# Patient Record
Sex: Female | Born: 1995 | Race: White | Hispanic: No | Marital: Single | Smoking: Never smoker
Health system: Southern US, Community
[De-identification: ages and names within clinical notes are randomized; demographics above are authoritative.]

## PROBLEM LIST (undated history)

## (undated) DIAGNOSIS — M1990 Unspecified osteoarthritis, unspecified site: Secondary | ICD-10-CM

## (undated) DIAGNOSIS — R7303 Prediabetes: Principal | ICD-10-CM

## (undated) DIAGNOSIS — M0880 Other juvenile arthritis, unspecified site: Secondary | ICD-10-CM

## (undated) HISTORY — DX: Other juvenile arthritis, unspecified site: M08.80

## (undated) HISTORY — PX: ANKLE ARTHROCENTESIS: SUR45

## (undated) HISTORY — PX: KNEE ARTHROCENTESIS: SUR44

## (undated) HISTORY — PX: WISDOM TOOTH EXTRACTION: SHX21

## (undated) HISTORY — DX: Unspecified osteoarthritis, unspecified site: M19.90

---
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
## 2016-01-26 ENCOUNTER — Ambulatory Visit (INDEPENDENT_AMBULATORY_CARE_PROVIDER_SITE_OTHER): Payer: Federal, State, Local not specified - PPO | Admitting: Family Medicine

## 2016-01-26 VITALS — BP 121/65 | HR 53 | Temp 97.8°F | Resp 16

## 2016-01-26 DIAGNOSIS — J018 Other acute sinusitis: Secondary | ICD-10-CM

## 2016-01-26 DIAGNOSIS — J069 Acute upper respiratory infection, unspecified: Secondary | ICD-10-CM

## 2016-01-26 MED ORDER — CEFDINIR 300 MG PO CAPS: ORAL_CAPSULE | ORAL | 0 refills | Status: DC

## 2016-01-26 NOTE — Progress Notes (Signed)
Patient presents with symptoms of sore throat, nasal congestion, sinus pressure, cough for the last week. Denies fever, N/V/D, abdominal pain, severe headache, fever. Has colored mucus at times. Would like to take medication that her PMD has prescribed in the past for sinusitis.   ROS: Negative except mentioned above.  GENERAL: NAD HEENT: mild pharyngeal erythema, no exudate, no erythema of TMs, no cervical LAD RESP: CTA B CARD: RRR NEURO: CN II-XII grossly intact   A/P: Sinusitis, URI - rapid strep negative, will be on Omnicef as requested by patient, oral antihistamine, nasal spray, Delysm prn, rest, hydration, seek medical attention if symptoms persist or worsen. No athletic activity if febrile.

## 2016-02-01 ENCOUNTER — Ambulatory Visit (INDEPENDENT_AMBULATORY_CARE_PROVIDER_SITE_OTHER): Payer: Federal, State, Local not specified - PPO | Admitting: Family Medicine

## 2016-02-01 VITALS — BP 112/56 | HR 57 | Temp 96.9°F | Resp 14

## 2016-02-01 DIAGNOSIS — J069 Acute upper respiratory infection, unspecified: Secondary | ICD-10-CM

## 2016-02-01 MED ORDER — PREDNISONE 20 MG PO TABS: ORAL_TABLET | ORAL | 0 refills | Status: DC

## 2016-02-01 NOTE — Progress Notes (Signed)
Patient presents today with symptoms of persistent cough and nasal congestion. Patient states that she is continue to take her Omnicef. She is also using Mucinex. She denies any fever or chills. She states the colored mucus has decreased. She denies any chest pain, shortness of breath, severe headache. She denies any history of asthma.  ROS: Negative except mentioned above. Vitals as per Epic. GENERAL: NAD HEENT: mild pharyngeal erythema, no exudate, no erythema of TMs, no cervical LAD RESP: CTA B CARD: RRR NEURO: CN II-XII grossly intact   A/P: URI - continue course of Omnicef, try Delsym instead of Mucinex, can take Tussionex at bedtime if cough worse at night, will also start short course of prednisone, rest, hydration, seek medical attention if symptoms still persist/worsen. Would recommend imaging and/or labs if symptoms persist/worsen.

## 2016-02-22 ENCOUNTER — Encounter: Payer: Self-pay | Admitting: Family Medicine

## 2016-02-22 ENCOUNTER — Ambulatory Visit (INDEPENDENT_AMBULATORY_CARE_PROVIDER_SITE_OTHER): Payer: Federal, State, Local not specified - PPO | Admitting: Family Medicine

## 2016-02-22 VITALS — BP 117/67 | HR 61 | Temp 97.8°F | Resp 16

## 2016-02-22 DIAGNOSIS — M25562 Pain in left knee: Secondary | ICD-10-CM

## 2016-02-22 DIAGNOSIS — M25561 Pain in right knee: Secondary | ICD-10-CM

## 2016-02-22 DIAGNOSIS — J069 Acute upper respiratory infection, unspecified: Secondary | ICD-10-CM

## 2016-02-22 MED ORDER — AZITHROMYCIN 250 MG PO TABS: ORAL_TABLET | ORAL | 0 refills | Status: DC

## 2016-02-22 MED ORDER — PREDNISONE 20 MG PO TABS: ORAL_TABLET | ORAL | 0 refills | Status: AC

## 2016-02-22 NOTE — Progress Notes (Signed)
Patient presents with symptoms of generalized joint pain, sore throat, nasal congestion for the last few days. Patient was sick with similar symptoms a month ago as well. She states that she got better after taking the Omnicef and prednisone that was prescribed last month. She has a teammate that she has been around that has also been sick with upper respiratory symptoms for about a month. She denies any chest pain, shortness of breath, night sweats, severe headache, abdominal pain, nausea, vomiting, diarrhea. She has not had to take any of her oral prednisone at home that she has for flares of her JRA.  ROS: Negative except mentioned above.  Vitals as per Epic.  GENERAL: NAD HEENT: mild pharyngeal erythema, no exudate, no erythema of TMs, mild cervical LAD RESP: CTA B CARD: RRR MSK: no obvious swelling of joints, FROM NEURO: CN II-XII grossly intact   A/P: URI, JRA - Will prescribe patient Z-Pak, oral prednisone for 4 days, rest, hydration, symptomatic relief with antihistamine/decongestant, nasal steroid, cough suppressant. No athletic activity afebrile. Will follow up with me if symptoms do continue to persist or worsen. If she does return I will do labs and imaging if needed.

## 2016-04-05 ENCOUNTER — Ambulatory Visit
Admission: RE | Admit: 2016-04-05 | Discharge: 2016-04-05 | Disposition: A | Discharge status: Home / Self Care | Payer: Federal, State, Local not specified - PPO | Source: Ambulatory Visit | Attending: Family Medicine | Admitting: Family Medicine

## 2016-04-05 ENCOUNTER — Ambulatory Visit (INDEPENDENT_AMBULATORY_CARE_PROVIDER_SITE_OTHER): Payer: Federal, State, Local not specified - PPO | Admitting: Family Medicine

## 2016-04-05 ENCOUNTER — Encounter: Payer: Self-pay | Admitting: Family Medicine

## 2016-04-05 VITALS — BP 115/65 | HR 70 | Temp 98.2°F | Resp 14

## 2016-04-05 DIAGNOSIS — M25531 Pain in right wrist: Secondary | ICD-10-CM | POA: Diagnosis not present

## 2016-04-05 DIAGNOSIS — R0982 Postnasal drip: Secondary | ICD-10-CM

## 2016-04-05 DIAGNOSIS — J309 Allergic rhinitis, unspecified: Secondary | ICD-10-CM

## 2016-04-05 NOTE — Progress Notes (Signed)
Patient presents today with symptoms of right wrist pain that she states she has had for a few months. Thinking back she remembers landing on her right hand and then started to experience pain. Her trainer was taping it during volleyball season. She states that this did not help all the time. She denies any swelling or bruising of the area. 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 She states that the pain that she has in the wrist is different than her other joint pain she has with her arthritis. Her pain is mostly with extension and ulnar deviation. She has pain sometimes when she lifts something underhanded. She denies any instability of the wrist/hand. She will not be doing any volleyball activities for the next few weeks since it is exam time and Christmas break. They will have practice and weight lifting when they return in January. Patient also has had some hoarseness of her voice which is different than her chronic hoarseness. She has some postnasal drip and minimal cough. She denies any significant sore throat, fever or chills.  ROS: Negative except mentioned above. Vitals as per Epic.  GENERAL: NAD HEENT: mild pharyngeal erythema and erythema of uvula, no exudate, no erythema of TMs, no cervical LAD RESP: CTA B CARD: RRR MSK: Right wrist- no obvious deformity, no swelling or ecchymosis appreciated, mild tenderness of the ulnar styloid and mild tenderness proximal to this, full range of motion, discomfort reproduced with ulnar deviation and extension and lifting underhanded, NV intact NEURO: CN II-XII grossly intact    A/P: Right wrist pain - seems to be mostly in the TFCC region, will do x-rays initially to look for any bony abnormality, I have advised her to rest the wrist for the next few weeks and use a wrist splint if needed if has increased pain, if pain persists or worsens would recommend further imaging with MRI and seeing hand/wrist specialist if needed. Discussed with  trainer. Encourage patient to continue her allergy medicine and nasal steroid for postnasal drip and take OTC pain medication if needed. Gargle with some warm salt water for a few days if symptoms worsen seek medical attention.

## 2016-06-02 ENCOUNTER — Ambulatory Visit (INDEPENDENT_AMBULATORY_CARE_PROVIDER_SITE_OTHER): Payer: Federal, State, Local not specified - PPO | Admitting: Family Medicine

## 2016-06-02 VITALS — BP 128/77 | HR 68 | Temp 98.7°F

## 2016-06-02 DIAGNOSIS — Z889 Allergy status to unspecified drugs, medicaments and biological substances status: Secondary | ICD-10-CM | POA: Insufficient documentation

## 2016-06-02 NOTE — Progress Notes (Signed)
Patient presents today for discussion on allergy shots. Patient states that she started getting allergy shots over a month ago. She has a schedule with her from her allergist. She has the vials with her as well. She admits that she has an EpiPen in her purse. She admits to being allergic to several things including dust mites. She has never had allergy shots in the past before now. She denies having any illnesses at this time or fever.  A/P: Immunotherapy/Allergy Shots - Reviewed forms that were given to her by the allergist. CMA has also reviewed this with the RN at the [facility]. Will give allergy shots as prescribed by the allergist. I have informed the patient that she needs to be aware of where her allergy shots will be shipped. I have also reviewed with her the times that we are open during the school year and the summer so she knows when to come in for her injections. Seek medical attention as needed.

## 2016-06-06 ENCOUNTER — Ambulatory Visit (INDEPENDENT_AMBULATORY_CARE_PROVIDER_SITE_OTHER): Payer: Federal, State, Local not specified - PPO | Admitting: Family Medicine

## 2016-06-06 DIAGNOSIS — Z889 Allergy status to unspecified drugs, medicaments and biological substances status: Secondary | ICD-10-CM

## 2016-06-09 ENCOUNTER — Ambulatory Visit (INDEPENDENT_AMBULATORY_CARE_PROVIDER_SITE_OTHER): Payer: Federal, State, Local not specified - PPO | Admitting: Family Medicine

## 2016-06-09 DIAGNOSIS — Z889 Allergy status to unspecified drugs, medicaments and biological substances status: Secondary | ICD-10-CM

## 2016-06-13 ENCOUNTER — Ambulatory Visit (INDEPENDENT_AMBULATORY_CARE_PROVIDER_SITE_OTHER): Payer: Federal, State, Local not specified - PPO | Admitting: Family Medicine

## 2016-06-13 DIAGNOSIS — Z889 Allergy status to unspecified drugs, medicaments and biological substances status: Secondary | ICD-10-CM

## 2016-06-16 ENCOUNTER — Ambulatory Visit (INDEPENDENT_AMBULATORY_CARE_PROVIDER_SITE_OTHER): Payer: Federal, State, Local not specified - PPO | Admitting: Family Medicine

## 2016-06-16 DIAGNOSIS — Z889 Allergy status to unspecified drugs, medicaments and biological substances status: Secondary | ICD-10-CM

## 2016-06-21 ENCOUNTER — Ambulatory Visit (INDEPENDENT_AMBULATORY_CARE_PROVIDER_SITE_OTHER): Payer: Federal, State, Local not specified - PPO | Admitting: Family Medicine

## 2016-06-21 DIAGNOSIS — Z889 Allergy status to unspecified drugs, medicaments and biological substances status: Secondary | ICD-10-CM

## 2016-06-23 ENCOUNTER — Encounter: Payer: Self-pay | Admitting: Family Medicine

## 2016-06-23 ENCOUNTER — Ambulatory Visit (INDEPENDENT_AMBULATORY_CARE_PROVIDER_SITE_OTHER): Payer: Federal, State, Local not specified - PPO | Admitting: Family Medicine

## 2016-06-23 VITALS — BP 112/62 | HR 62 | Temp 98.1°F | Resp 14

## 2016-06-23 DIAGNOSIS — J069 Acute upper respiratory infection, unspecified: Secondary | ICD-10-CM

## 2016-06-24 NOTE — Progress Notes (Signed)
Look at scanned H&P. 

## 2016-06-27 ENCOUNTER — Ambulatory Visit (INDEPENDENT_AMBULATORY_CARE_PROVIDER_SITE_OTHER): Payer: Federal, State, Local not specified - PPO | Admitting: Family Medicine

## 2016-06-27 DIAGNOSIS — Z889 Allergy status to unspecified drugs, medicaments and biological substances status: Secondary | ICD-10-CM

## 2016-06-28 ENCOUNTER — Ambulatory Visit (INDEPENDENT_AMBULATORY_CARE_PROVIDER_SITE_OTHER): Payer: Federal, State, Local not specified - PPO | Admitting: Family Medicine

## 2016-06-28 DIAGNOSIS — Z889 Allergy status to unspecified drugs, medicaments and biological substances status: Secondary | ICD-10-CM

## 2016-06-30 ENCOUNTER — Ambulatory Visit (INDEPENDENT_AMBULATORY_CARE_PROVIDER_SITE_OTHER): Payer: Federal, State, Local not specified - PPO | Admitting: Family Medicine

## 2016-06-30 DIAGNOSIS — Z889 Allergy status to unspecified drugs, medicaments and biological substances status: Secondary | ICD-10-CM

## 2016-07-04 ENCOUNTER — Ambulatory Visit (INDEPENDENT_AMBULATORY_CARE_PROVIDER_SITE_OTHER): Payer: Federal, State, Local not specified - PPO | Admitting: Family Medicine

## 2016-07-04 DIAGNOSIS — Z889 Allergy status to unspecified drugs, medicaments and biological substances status: Secondary | ICD-10-CM

## 2016-07-05 ENCOUNTER — Ambulatory Visit (INDEPENDENT_AMBULATORY_CARE_PROVIDER_SITE_OTHER): Payer: Federal, State, Local not specified - PPO | Admitting: Family Medicine

## 2016-07-05 DIAGNOSIS — Z889 Allergy status to unspecified drugs, medicaments and biological substances status: Secondary | ICD-10-CM

## 2016-07-06 ENCOUNTER — Ambulatory Visit (INDEPENDENT_AMBULATORY_CARE_PROVIDER_SITE_OTHER): Payer: Federal, State, Local not specified - PPO | Admitting: Family Medicine

## 2016-07-06 DIAGNOSIS — Z889 Allergy status to unspecified drugs, medicaments and biological substances status: Secondary | ICD-10-CM

## 2016-07-12 ENCOUNTER — Ambulatory Visit (INDEPENDENT_AMBULATORY_CARE_PROVIDER_SITE_OTHER): Payer: Federal, State, Local not specified - PPO | Admitting: Family Medicine

## 2016-07-12 VITALS — Temp 98.3°F

## 2016-07-12 DIAGNOSIS — Z889 Allergy status to unspecified drugs, medicaments and biological substances status: Secondary | ICD-10-CM

## 2016-07-13 ENCOUNTER — Encounter: Payer: Self-pay | Admitting: Family Medicine

## 2016-07-13 ENCOUNTER — Ambulatory Visit (INDEPENDENT_AMBULATORY_CARE_PROVIDER_SITE_OTHER): Payer: Federal, State, Local not specified - PPO | Admitting: Family Medicine

## 2016-07-13 VITALS — BP 123/69 | HR 78 | Temp 98.7°F | Resp 14

## 2016-07-13 DIAGNOSIS — J018 Other acute sinusitis: Secondary | ICD-10-CM

## 2016-07-13 MED ORDER — AMOXICILLIN 500 MG PO CAPS: 500.0000 mg | ORAL_CAPSULE | Freq: Two times a day (BID) | ORAL | 0 refills | Status: AC

## 2016-07-13 NOTE — Progress Notes (Signed)
Patient presents today for sinus congestion which patient states is green in color. Patient states that she has had a runny nose for the last few days but then started to become colored yesterday. She does normally take a nasal steroid and Allegra for her allergies. She is getting allergy shots and is scheduled to have one today. She denies any fever. She has mild sore throat and some cough. She denies chest pain or shortness of breath. She does have asthma and does have an Albuterol inhaler but has not used it today. She denies any history of a deviated septum or other nasal problems.  ROS: Negative except mentioned above. Vitals as per Epic.  GENERAL: NAD HEENT: mild pharyngeal erythema, no exudate, no erythema of TMs, no cervical LAD RESP: CTA B, no accessory muscle use, no wheezing CARD: RRR NEURO: CN II-XII grossly intact   A/P: Sinusitis, URI - will treat with Amoxicillin, continue nasal steroid and oral antihistamine, Tylenol/NSAIDs when necessary, rest, hydration, seek medical attention if symptoms persist or worsen. Patient will call allergy physician to see if she can continue her allergy shots today.

## 2016-08-09 ENCOUNTER — Ambulatory Visit (INDEPENDENT_AMBULATORY_CARE_PROVIDER_SITE_OTHER): Payer: Federal, State, Local not specified - PPO | Admitting: Family Medicine

## 2016-08-09 DIAGNOSIS — Z889 Allergy status to unspecified drugs, medicaments and biological substances status: Secondary | ICD-10-CM

## 2016-08-11 ENCOUNTER — Ambulatory Visit (INDEPENDENT_AMBULATORY_CARE_PROVIDER_SITE_OTHER): Payer: Federal, State, Local not specified - PPO | Admitting: Family Medicine

## 2016-08-11 DIAGNOSIS — Z889 Allergy status to unspecified drugs, medicaments and biological substances status: Secondary | ICD-10-CM

## 2016-08-16 ENCOUNTER — Ambulatory Visit (INDEPENDENT_AMBULATORY_CARE_PROVIDER_SITE_OTHER): Payer: Federal, State, Local not specified - PPO | Admitting: Family Medicine

## 2016-08-16 DIAGNOSIS — Z889 Allergy status to unspecified drugs, medicaments and biological substances status: Secondary | ICD-10-CM

## 2016-08-18 ENCOUNTER — Ambulatory Visit (INDEPENDENT_AMBULATORY_CARE_PROVIDER_SITE_OTHER): Payer: Federal, State, Local not specified - PPO | Admitting: Family Medicine

## 2016-08-18 DIAGNOSIS — Z889 Allergy status to unspecified drugs, medicaments and biological substances status: Secondary | ICD-10-CM

## 2016-08-23 ENCOUNTER — Ambulatory Visit (INDEPENDENT_AMBULATORY_CARE_PROVIDER_SITE_OTHER): Payer: Federal, State, Local not specified - PPO | Admitting: Family Medicine

## 2016-08-23 DIAGNOSIS — Z889 Allergy status to unspecified drugs, medicaments and biological substances status: Secondary | ICD-10-CM

## 2016-08-25 ENCOUNTER — Ambulatory Visit (INDEPENDENT_AMBULATORY_CARE_PROVIDER_SITE_OTHER): Payer: Federal, State, Local not specified - PPO | Admitting: Family Medicine

## 2016-08-25 DIAGNOSIS — Z889 Allergy status to unspecified drugs, medicaments and biological substances status: Secondary | ICD-10-CM

## 2016-08-30 ENCOUNTER — Ambulatory Visit: Payer: Federal, State, Local not specified - PPO | Admitting: Family Medicine

## 2016-09-06 ENCOUNTER — Ambulatory Visit (INDEPENDENT_AMBULATORY_CARE_PROVIDER_SITE_OTHER): Payer: Federal, State, Local not specified - PPO | Admitting: Family Medicine

## 2016-09-06 DIAGNOSIS — Z889 Allergy status to unspecified drugs, medicaments and biological substances status: Secondary | ICD-10-CM

## 2016-12-16 ENCOUNTER — Encounter: Payer: Self-pay | Admitting: Family Medicine

## 2016-12-16 ENCOUNTER — Ambulatory Visit (INDEPENDENT_AMBULATORY_CARE_PROVIDER_SITE_OTHER): Payer: Federal, State, Local not specified - PPO | Admitting: Family Medicine

## 2016-12-16 VITALS — BP 105/68 | HR 84 | Temp 98.0°F | Resp 14

## 2016-12-16 DIAGNOSIS — N760 Acute vaginitis: Secondary | ICD-10-CM

## 2016-12-16 DIAGNOSIS — R300 Dysuria: Secondary | ICD-10-CM

## 2016-12-16 LAB — POCT URINE PREGNANCY: PREG TEST UR: NEGATIVE

## 2016-12-16 MED ORDER — FLUCONAZOLE 150 MG PO TABS: 150.0000 mg | ORAL_TABLET | Freq: Once | ORAL | 1 refills | Status: AC

## 2016-12-16 NOTE — Progress Notes (Signed)
Patient presents today with some burning with urination and itchiness in the vaginal area. She denies any vaginal lesions or rash. She has experienced this for the last few days. She wonders whether she has yeast infection because she has been wearing spandex for several hours with volleyball daily. She does describe some discharge but it does not have an odor to it. She is sexually active but is having protected sex with condoms. She admits that her last menstrual cycle was August 2. She denies any history of STDs. She denies any abdominal pain, fever, chills.  ROS: Negative except mentioned above. Vitals as per Epic GENERAL: NAD RESP: CTA B CARD: RRR ABD: Positive bowel sounds, nontender, no rebound or guarding, no flank tenderness GU: deferred NEURO: CN II-XII grossly intact   Urine Dip - leukocytes negative, nitrites negative, trace protein, negative blood, specific gravity 1.010, trace ketones, negative glucose, pH 5.0 Urine pregnancy - negative  A/P: Vaginitis/Dysuria - will treat for yeast infection, keep area dry and clean, avoid staying and sweaty clothes for long periods of time, if symptoms do not resolve would recommend doing a full pelvic exam, patient addresses understanding of plan.

## 2016-12-20 ENCOUNTER — Ambulatory Visit (INDEPENDENT_AMBULATORY_CARE_PROVIDER_SITE_OTHER): Payer: Federal, State, Local not specified - PPO | Admitting: Family Medicine

## 2016-12-20 ENCOUNTER — Ambulatory Visit: Payer: Federal, State, Local not specified - PPO | Admitting: Family Medicine

## 2016-12-20 DIAGNOSIS — Z889 Allergy status to unspecified drugs, medicaments and biological substances status: Secondary | ICD-10-CM

## 2016-12-27 ENCOUNTER — Encounter: Payer: Self-pay | Admitting: Family Medicine

## 2016-12-27 ENCOUNTER — Ambulatory Visit (INDEPENDENT_AMBULATORY_CARE_PROVIDER_SITE_OTHER): Payer: Federal, State, Local not specified - PPO | Admitting: Family Medicine

## 2016-12-27 ENCOUNTER — Ambulatory Visit: Payer: Federal, State, Local not specified - PPO | Admitting: Family Medicine

## 2016-12-27 VITALS — BP 129/78 | HR 73 | Temp 98.4°F | Resp 14

## 2016-12-27 DIAGNOSIS — R0982 Postnasal drip: Secondary | ICD-10-CM

## 2016-12-27 NOTE — Progress Notes (Signed)
Patient presents today with symptoms of nasal congestion, nonproductive cough. Patient states that she's had the symptoms for the last 2 days. She denies any chest pain, shortness of breath, fever, severe headache. She denies being late with any of her allergy shots.  ROS: Negative except mentioned above. Vitals as per Epic. GENERAL: NAD HEENT: mild pharyngeal erythema, no exudate, no erythema of TMs, no cervical LAD RESP: CTA B CARD: RRR NEURO: CN II-XII grossly intact  A/P: Viral Illness, Postnasal drip - patient can try taking Claritin or Zyrtec, Tylenol/Ibuprofen when necessary, Delsym when necessary cough, monitor for any worsening symptoms. Recommend no athletic activity if febrile. Seek medical attention if symptoms persist or worsen as discussed.

## 2016-12-29 ENCOUNTER — Ambulatory Visit (INDEPENDENT_AMBULATORY_CARE_PROVIDER_SITE_OTHER): Payer: Federal, State, Local not specified - PPO | Admitting: Family Medicine

## 2016-12-29 DIAGNOSIS — Z889 Allergy status to unspecified drugs, medicaments and biological substances status: Secondary | ICD-10-CM

## 2017-01-03 ENCOUNTER — Ambulatory Visit (INDEPENDENT_AMBULATORY_CARE_PROVIDER_SITE_OTHER): Payer: Federal, State, Local not specified - PPO | Admitting: Family Medicine

## 2017-01-03 DIAGNOSIS — Z889 Allergy status to unspecified drugs, medicaments and biological substances status: Secondary | ICD-10-CM

## 2017-01-05 ENCOUNTER — Ambulatory Visit
Admission: RE | Admit: 2017-01-05 | Discharge: 2017-01-05 | Disposition: A | Discharge status: Home / Self Care | Payer: Federal, State, Local not specified - PPO | Source: Ambulatory Visit | Attending: Family Medicine | Admitting: Family Medicine

## 2017-01-05 ENCOUNTER — Ambulatory Visit (INDEPENDENT_AMBULATORY_CARE_PROVIDER_SITE_OTHER): Payer: Federal, State, Local not specified - PPO | Admitting: Family Medicine

## 2017-01-05 DIAGNOSIS — M899 Disorder of bone, unspecified: Secondary | ICD-10-CM | POA: Diagnosis not present

## 2017-01-05 DIAGNOSIS — M25572 Pain in left ankle and joints of left foot: Secondary | ICD-10-CM

## 2017-01-05 DIAGNOSIS — Z889 Allergy status to unspecified drugs, medicaments and biological substances status: Secondary | ICD-10-CM

## 2017-01-05 NOTE — Progress Notes (Signed)
Patient presents today with symptoms of left ankle pain for the past few days. Patient states that she has had a flare of her RA in her left ankle this past summer. She did get a steroid injection in the ankle which did relieve her symptoms. She has not had any pain with her left ankle since starting the volleyball season until now. She describes the pain as being similar to her joint pain in other places related to her RA. She denies any significant swelling or erythema of the area. She describes the pain as being around the pallor area and extending into the proximal foot. Her rheumatologist will not be back until next week. The nurse stated that she would benefit from a tapered dose of steroids. She currently is on methotrexate, another RA medication, and Naprosyn.  ROS: Negative except mentioned above. Vitals as per Epic. GENERAL: NAD RESP: CTA B CARD: RRR MSK: Left Ankle - no obvious erythema, ecchymosis or swelling, generalized tenderness in the talar dome area extending into the proximal foot, full range of motion, NV intact NEURO: CN II-XII grossly intact   A/P: Left ankle pain - likely related to her RA, will to x-rays however of the ankle, will proceed with doing tapered dose of steroids for 12 days, patient states that she has used tapered dose of steroids for 12 days in the past. Will stop taking Naprosyn for now while on Prednisone. Will seek medical attention if symptoms persist or worsen as discussed.

## 2017-01-10 ENCOUNTER — Ambulatory Visit (INDEPENDENT_AMBULATORY_CARE_PROVIDER_SITE_OTHER): Payer: Federal, State, Local not specified - PPO | Admitting: Family Medicine

## 2017-01-10 DIAGNOSIS — Z889 Allergy status to unspecified drugs, medicaments and biological substances status: Secondary | ICD-10-CM

## 2017-01-19 ENCOUNTER — Ambulatory Visit (INDEPENDENT_AMBULATORY_CARE_PROVIDER_SITE_OTHER): Payer: Federal, State, Local not specified - PPO | Admitting: Family Medicine

## 2017-01-19 DIAGNOSIS — Z889 Allergy status to unspecified drugs, medicaments and biological substances status: Secondary | ICD-10-CM

## 2017-01-24 ENCOUNTER — Ambulatory Visit (INDEPENDENT_AMBULATORY_CARE_PROVIDER_SITE_OTHER): Payer: Federal, State, Local not specified - PPO | Admitting: Family Medicine

## 2017-01-24 DIAGNOSIS — Z889 Allergy status to unspecified drugs, medicaments and biological substances status: Secondary | ICD-10-CM

## 2017-02-07 ENCOUNTER — Ambulatory Visit: Payer: Federal, State, Local not specified - PPO | Admitting: Family Medicine

## 2017-02-14 ENCOUNTER — Ambulatory Visit (INDEPENDENT_AMBULATORY_CARE_PROVIDER_SITE_OTHER): Payer: Federal, State, Local not specified - PPO | Admitting: Family Medicine

## 2017-02-14 DIAGNOSIS — Z889 Allergy status to unspecified drugs, medicaments and biological substances status: Secondary | ICD-10-CM

## 2017-02-16 ENCOUNTER — Ambulatory Visit (INDEPENDENT_AMBULATORY_CARE_PROVIDER_SITE_OTHER): Payer: Federal, State, Local not specified - PPO | Admitting: Family Medicine

## 2017-02-16 DIAGNOSIS — Z889 Allergy status to unspecified drugs, medicaments and biological substances status: Secondary | ICD-10-CM

## 2017-02-21 ENCOUNTER — Ambulatory Visit (INDEPENDENT_AMBULATORY_CARE_PROVIDER_SITE_OTHER): Payer: Federal, State, Local not specified - PPO | Admitting: Family Medicine

## 2017-02-21 DIAGNOSIS — Z889 Allergy status to unspecified drugs, medicaments and biological substances status: Secondary | ICD-10-CM

## 2017-02-23 ENCOUNTER — Ambulatory Visit (INDEPENDENT_AMBULATORY_CARE_PROVIDER_SITE_OTHER): Payer: Federal, State, Local not specified - PPO | Admitting: Family Medicine

## 2017-02-23 VITALS — BP 122/78 | HR 57 | Temp 98.3°F | Resp 14

## 2017-02-23 DIAGNOSIS — Z889 Allergy status to unspecified drugs, medicaments and biological substances status: Secondary | ICD-10-CM

## 2017-02-28 ENCOUNTER — Ambulatory Visit (INDEPENDENT_AMBULATORY_CARE_PROVIDER_SITE_OTHER): Payer: Federal, State, Local not specified - PPO | Admitting: Family Medicine

## 2017-02-28 DIAGNOSIS — Z889 Allergy status to unspecified drugs, medicaments and biological substances status: Secondary | ICD-10-CM

## 2017-03-14 ENCOUNTER — Encounter: Payer: Self-pay | Admitting: Family Medicine

## 2017-03-14 ENCOUNTER — Ambulatory Visit (INDEPENDENT_AMBULATORY_CARE_PROVIDER_SITE_OTHER): Payer: Federal, State, Local not specified - PPO | Admitting: Family Medicine

## 2017-03-14 ENCOUNTER — Other Ambulatory Visit: Payer: Self-pay | Admitting: Family Medicine

## 2017-03-14 DIAGNOSIS — Z889 Allergy status to unspecified drugs, medicaments and biological substances status: Secondary | ICD-10-CM

## 2017-03-14 DIAGNOSIS — S4992XA Unspecified injury of left shoulder and upper arm, initial encounter: Secondary | ICD-10-CM

## 2017-03-14 NOTE — Progress Notes (Signed)
Patient presents today with posterior left shoulder pain. Patient states that during the volleyball game on Sunday she landed on an outstretched left arm. She states that she was able to continue to play volleyball but started to have discomfort later that evening. It has been uncomfortable to sleep on that shoulder as well. She denies any history of left shoulder dislocation or subluxation in the past. She does admit to having her right shoulder sublux one time. She denies any significant weakness of the left arm. She denies any tingling or numbness of the left arm. She has started to take Naproxen 500 mg twice daily. The volleyball season is over.   ROS: Negative except mentioned above. Vitals as per Epic. GENERAL: NAD RESP: CTA B CARD: RRR MSK: Left shoulder - no obvious deformity, posterior shoulder tenderness > anterior shoulder tenderness, full range of motion, discomfort with external rotation and abduction, negative empty can, negative lift off, positive O'Brien's, positive apprehension, negative cross arm, NV intact NEURO: CN II-XII grossly intact   A/P: Left shoulder injury - will get x-rays, continue Naprosyn through Thanksgiving break and follow up with me if symptoms persist or worsen after the break, if symptoms are persistent or worsening will do MRI to look at labrum, etc. Can do lower body activity. Patient addresses understanding and has no further questions.

## 2017-03-27 ENCOUNTER — Ambulatory Visit
Admission: RE | Admit: 2017-03-27 | Discharge: 2017-03-27 | Disposition: A | Discharge status: Home / Self Care | Payer: Federal, State, Local not specified - PPO | Source: Ambulatory Visit | Attending: Family Medicine | Admitting: Family Medicine

## 2017-03-27 DIAGNOSIS — X58XXXA Exposure to other specified factors, initial encounter: Secondary | ICD-10-CM | POA: Insufficient documentation

## 2017-03-27 DIAGNOSIS — S4992XA Unspecified injury of left shoulder and upper arm, initial encounter: Secondary | ICD-10-CM

## 2017-03-27 DIAGNOSIS — M25512 Pain in left shoulder: Secondary | ICD-10-CM | POA: Insufficient documentation

## 2017-03-28 ENCOUNTER — Ambulatory Visit (INDEPENDENT_AMBULATORY_CARE_PROVIDER_SITE_OTHER): Payer: Federal, State, Local not specified - PPO | Admitting: Family Medicine

## 2017-03-28 ENCOUNTER — Encounter: Payer: Self-pay | Admitting: Family Medicine

## 2017-03-28 ENCOUNTER — Other Ambulatory Visit: Payer: Self-pay | Admitting: Family Medicine

## 2017-03-28 DIAGNOSIS — M25512 Pain in left shoulder: Secondary | ICD-10-CM

## 2017-03-28 NOTE — Progress Notes (Signed)
Patient presents today for follow-up regarding her left shoulder pain. Patient states that time and rest have not helped much regarding her left shoulder pain since volleyball has ended. She still has pain along the anterior aspect of the shoulder. She does feel heaviness of the shoulder at times. She denies any swelling of her arm or any tingling or numbness. She does have some difficulty sleeping on that shoulder. She did attempt to do some lifting in the weight room yesterday that seemed to exacerbate her symptoms. She denies any subluxation or dislocation episodes. This pain initially started after landing on an outstretched left arm playing volleyball.  ROS: Negative except mentioned above.  GENERAL: NAD MSK: Left Shoulder - mild to moderate anterior tenderness, full range of motion, positive apprehension, negative empty can, negative impingement signs, negative O'Brien's, negative lift off, NV intact NEURO: CN II-XII grossly intact   A/P: Left Shoulder Pain - reviewed x-rays, given persistent symptoms will move forward with doing an MRI to further evaluate for labral injury, etc. Would recommend not doing any lifting with the left arm in the weight room for now. Pain medication when necessary. Will discuss plan with trainer. Seek medical attention if any acute worsening symptoms.

## 2017-03-31 ENCOUNTER — Ambulatory Visit
Admission: RE | Admit: 2017-03-31 | Discharge: 2017-03-31 | Disposition: A | Discharge status: Home / Self Care | Payer: Federal, State, Local not specified - PPO | Source: Ambulatory Visit | Attending: Family Medicine | Admitting: Family Medicine

## 2017-03-31 ENCOUNTER — Other Ambulatory Visit: Payer: Self-pay | Admitting: Family Medicine

## 2017-03-31 DIAGNOSIS — M25512 Pain in left shoulder: Secondary | ICD-10-CM | POA: Diagnosis present

## 2017-07-03 ENCOUNTER — Ambulatory Visit (INDEPENDENT_AMBULATORY_CARE_PROVIDER_SITE_OTHER): Payer: Federal, State, Local not specified - PPO | Admitting: Family Medicine

## 2017-07-03 ENCOUNTER — Encounter: Payer: Self-pay | Admitting: Family Medicine

## 2017-07-03 DIAGNOSIS — Z889 Allergy status to unspecified drugs, medicaments and biological substances status: Secondary | ICD-10-CM

## 2017-07-03 DIAGNOSIS — M79671 Pain in right foot: Secondary | ICD-10-CM

## 2017-07-03 NOTE — Progress Notes (Signed)
Patient presents today with symptoms of right foot pain. Patient states that she ran a 5K yesterday and then went to watch the [employer] game. She states that she did not have any pain significant pain to her foot after running the 5K but did have pain 2 hours later after watching the [employer] game. She denies any history of having a stress injury in the past. She states that normally she runs about 1-3 miles a week. She admits to having pain at the end of the volleyball season in November in the same area but thought it was due to her shoes being too tight. She denies any pain to the left foot. She denies having any pain leading up to running the 5K yesterday. She denies any menstrual irregularities. She denies any known vitamin D deficiency. She denies having 400 miles on her running shoes.  ROS: Negative except mentioned above. Vitals as per Epic. GENERAL: NAD MSK: Right foot - no obvious swelling or ecchymosis, there is mild tenderness at the base of the fifth metatarsal and slightly distal to this, full range of motion, no significant pain with walking on heels or toes, positive hop test, NV intact NEURO: CN II-XII grossly intact   A/P: Right foot pain - will get x-rays of her foot to look at the base of the fifth metatarsal, NSAIDs when necessary, ice when necessary, advised patient to wear a walking boot for now and is pain with weightbearing to use crutches, seek medical attention if any acute problems. Will discuss above with trainer.

## 2017-07-04 ENCOUNTER — Ambulatory Visit
Admission: RE | Admit: 2017-07-04 | Discharge: 2017-07-04 | Disposition: A | Discharge status: Home / Self Care | Payer: Federal, State, Local not specified - PPO | Source: Ambulatory Visit | Attending: Family Medicine | Admitting: Family Medicine

## 2017-07-04 DIAGNOSIS — M79671 Pain in right foot: Secondary | ICD-10-CM | POA: Insufficient documentation

## 2017-07-06 ENCOUNTER — Other Ambulatory Visit: Payer: Self-pay | Admitting: Family Medicine

## 2017-07-06 DIAGNOSIS — M79671 Pain in right foot: Secondary | ICD-10-CM

## 2017-07-08 ENCOUNTER — Ambulatory Visit
Admission: RE | Admit: 2017-07-08 | Discharge: 2017-07-08 | Disposition: A | Discharge status: Home / Self Care | Payer: Federal, State, Local not specified - PPO | Source: Ambulatory Visit | Attending: Family Medicine | Admitting: Family Medicine

## 2017-07-08 DIAGNOSIS — R609 Edema, unspecified: Secondary | ICD-10-CM | POA: Diagnosis not present

## 2017-07-08 DIAGNOSIS — M79671 Pain in right foot: Secondary | ICD-10-CM

## 2017-07-08 DIAGNOSIS — Q742 Other congenital malformations of lower limb(s), including pelvic girdle: Secondary | ICD-10-CM | POA: Insufficient documentation

## 2017-07-08 DIAGNOSIS — M659 Synovitis and tenosynovitis, unspecified: Secondary | ICD-10-CM | POA: Insufficient documentation

## 2017-07-17 ENCOUNTER — Ambulatory Visit (INDEPENDENT_AMBULATORY_CARE_PROVIDER_SITE_OTHER): Payer: Federal, State, Local not specified - PPO | Admitting: Family Medicine

## 2017-07-17 DIAGNOSIS — Z889 Allergy status to unspecified drugs, medicaments and biological substances status: Secondary | ICD-10-CM

## 2017-07-20 ENCOUNTER — Ambulatory Visit (INDEPENDENT_AMBULATORY_CARE_PROVIDER_SITE_OTHER): Payer: Federal, State, Local not specified - PPO | Admitting: Podiatry

## 2017-07-20 ENCOUNTER — Encounter: Payer: Self-pay | Admitting: Podiatry

## 2017-07-20 DIAGNOSIS — M7671 Peroneal tendinitis, right leg: Secondary | ICD-10-CM

## 2017-07-20 DIAGNOSIS — S93601A Unspecified sprain of right foot, initial encounter: Secondary | ICD-10-CM

## 2017-07-20 NOTE — Progress Notes (Signed)
This patient presents the office with chief complaint of pain on the outside top of her right foot.  She says the pain has been present for 3 weeks.  She says she aggravated her foot running a 5 K.  She says that she went to the urgent care where she was diagnosed as having tendinitis and possible bone injury to the outside of her right midfoot.  She says that x-rays were performed as well as an MRI.  These studies reported a tendinitis to the peroneal tendon right foot.  No bony pathology was noted at the site of her pain.  She was also been wearing a Cam Walker on her right foot.  She says she had severe pain and discomfort when the pain was initially present but her pain level is now 2 out of 10.  She says she has been taking naproxen for pain. She presents to the office  today for an evaluation and treatment of her right foot.  General Appearance  Alert, conversant and in no acute stress.  Vascular  Dorsalis pedis and posterior tibial  pulses are palpable  bilaterally.  Capillary return is within normal limits  bilaterally. Temperature is within normal limits  bilaterally.  Neurologic  Senn-Weinstein monofilament wire test within normal limits  bilaterally. Muscle power within normal limits bilaterally.  Nails Normal nails noted with no evidence of bacterial or fungal infections.  Orthopedic  No limitations of motion of motion feet .  No crepitus or effusions noted.  No evidence of any redness, swelling or ecchymosis noted along the course of the peroneal tendon to its insertion at the base of the fifth metatarsal.  Pain is present at the insertionat the base of the fifth metatarsal.    Skin  normotropic skin with no porokeratosis noted bilaterally.  No signs of infections or ulcers noted.    Peroneal tendinitis right foot.  Cuboid syndrome right foot.   IE  Reviewed her x-rays and MRI.  No bony pathology noted.  Tendinitis is present at the insertion at the base of the fifth metatarsal right  foot.  Discussed this condition with this patient.  She is to return to wearing her custom made orthotics in her shoes and to exercise with these orthoses.  This patient states that she has rheumatoid arthritis and is scheduled  to be seen by her doctor on Monday.  She is not interested in injection therapy.  RTC prn.   Helane GuntherGregory Levetta Bognar DPM

## 2017-07-31 ENCOUNTER — Ambulatory Visit (INDEPENDENT_AMBULATORY_CARE_PROVIDER_SITE_OTHER): Payer: Federal, State, Local not specified - PPO | Admitting: Family Medicine

## 2017-07-31 DIAGNOSIS — Z889 Allergy status to unspecified drugs, medicaments and biological substances status: Secondary | ICD-10-CM

## 2017-09-07 ENCOUNTER — Ambulatory Visit (INDEPENDENT_AMBULATORY_CARE_PROVIDER_SITE_OTHER): Payer: Federal, State, Local not specified - PPO | Admitting: Family Medicine

## 2017-09-07 DIAGNOSIS — Z889 Allergy status to unspecified drugs, medicaments and biological substances status: Secondary | ICD-10-CM

## 2017-12-26 ENCOUNTER — Ambulatory Visit (INDEPENDENT_AMBULATORY_CARE_PROVIDER_SITE_OTHER): Payer: Federal, State, Local not specified - PPO | Admitting: Family Medicine

## 2017-12-26 DIAGNOSIS — Z889 Allergy status to unspecified drugs, medicaments and biological substances status: Secondary | ICD-10-CM

## 2018-01-02 ENCOUNTER — Ambulatory Visit (INDEPENDENT_AMBULATORY_CARE_PROVIDER_SITE_OTHER): Payer: Federal, State, Local not specified - PPO | Admitting: Family Medicine

## 2018-01-02 ENCOUNTER — Encounter: Payer: Self-pay | Admitting: Family Medicine

## 2018-01-02 DIAGNOSIS — M25511 Pain in right shoulder: Secondary | ICD-10-CM

## 2018-01-02 MED ORDER — NAPROXEN 500 MG PO TABS: 500.0000 mg | ORAL_TABLET | Freq: Two times a day (BID) | ORAL | 0 refills | Status: AC

## 2018-01-23 ENCOUNTER — Ambulatory Visit (INDEPENDENT_AMBULATORY_CARE_PROVIDER_SITE_OTHER): Payer: Federal, State, Local not specified - PPO | Admitting: Family Medicine

## 2018-01-23 DIAGNOSIS — Z889 Allergy status to unspecified drugs, medicaments and biological substances status: Secondary | ICD-10-CM

## 2018-02-03 NOTE — Progress Notes (Signed)
Patient presents with symptoms of right shoulder pain for the last few days. Denies any trauma to the shoulder recently. Admits that she has playing volleyball more then usual recently. She denies any clicking or popping of the shoulder. She denies any hx of dislocation/subluxation. Patient does have history of JRA. She denies any other joint pain at this time. She has oral Prednisone at home given to her by her Rheumatologist in case she has flares of her JRA.  ROS: Negative except mentioned above. Vitals as per Epic.  GENERAL: NAD RESP: CTA B CARD: RRR MSK: R Shoulder - no obvious deformity, erythema, or warmth, mild anterior tenderness, FROM, some discomfort at 130 degrees of forward flexion, -empty can, -lift off, +impingement signs, -O'Briens, -apprehension, nv intact  NEURO: CN II-XII grossly intact   A/P: R Shoulder Pain - discussed likely etiology related to overuse, can try Naprosyn for a few days, possible to cause flare of RA so consider oral Prednisone if needed, ice, rest, modify activity with hitting for now and advance once able to, consider imaging if symptoms persist/worsen, seek medical attention if any change or worsening symptoms, discussed plan with athletic trainer.

## 2018-02-08 ENCOUNTER — Other Ambulatory Visit (INDEPENDENT_AMBULATORY_CARE_PROVIDER_SITE_OTHER): Payer: Federal, State, Local not specified - PPO | Admitting: Family Medicine

## 2018-02-08 ENCOUNTER — Other Ambulatory Visit: Payer: Self-pay | Admitting: Family Medicine

## 2018-02-08 DIAGNOSIS — M0800 Unspecified juvenile rheumatoid arthritis of unspecified site: Secondary | ICD-10-CM

## 2018-02-08 DIAGNOSIS — Z789 Other specified health status: Secondary | ICD-10-CM

## 2018-02-09 LAB — CBC WITH DIFFERENTIAL/PLATELET
Basophils Absolute: 0 10*3/uL (ref 0.0–0.2)
Basos: 0 %
EOS (ABSOLUTE): 0.2 10*3/uL (ref 0.0–0.4)
EOS: 2 %
HEMATOCRIT: 44.4 % (ref 34.0–46.6)
HEMOGLOBIN: 14.9 g/dL (ref 11.1–15.9)
Immature Grans (Abs): 0 10*3/uL (ref 0.0–0.1)
Immature Granulocytes: 0 %
LYMPHS ABS: 1.1 10*3/uL (ref 0.7–3.1)
LYMPHS: 13 %
MCH: 32.3 pg (ref 26.6–33.0)
MCHC: 33.6 g/dL (ref 31.5–35.7)
MCV: 96 fL (ref 79–97)
Monocytes Absolute: 0.6 10*3/uL (ref 0.1–0.9)
Monocytes: 8 %
NEUTROS PCT: 77 %
Neutrophils Absolute: 6.4 10*3/uL (ref 1.4–7.0)
Platelets: 229 10*3/uL (ref 150–450)
RBC: 4.61 x10E6/uL (ref 3.77–5.28)
RDW: 12.5 % (ref 12.3–15.4)
WBC: 8.4 10*3/uL (ref 3.4–10.8)

## 2018-02-09 LAB — COMPREHENSIVE METABOLIC PANEL
ALT: 54 IU/L — AB (ref 0–32)
AST: 39 IU/L (ref 0–40)
Albumin/Globulin Ratio: 2.6 — ABNORMAL HIGH (ref 1.2–2.2)
Albumin: 5 g/dL (ref 3.5–5.5)
Alkaline Phosphatase: 45 IU/L (ref 39–117)
BILIRUBIN TOTAL: 0.8 mg/dL (ref 0.0–1.2)
BUN/Creatinine Ratio: 19 (ref 9–23)
BUN: 18 mg/dL (ref 6–20)
CO2: 23 mmol/L (ref 20–29)
CREATININE: 0.95 mg/dL (ref 0.57–1.00)
Calcium: 9.7 mg/dL (ref 8.7–10.2)
Chloride: 99 mmol/L (ref 96–106)
GFR calc Af Amer: 99 mL/min/{1.73_m2} (ref >59)
GFR calc non Af Amer: 86 mL/min/{1.73_m2} (ref >59)
Globulin, Total: 1.9 g/dL (ref 1.5–4.5)
Glucose: 117 mg/dL — ABNORMAL HIGH (ref 65–99)
Potassium: 3.9 mmol/L (ref 3.5–5.2)
Sodium: 139 mmol/L (ref 134–144)
Total Protein: 6.9 g/dL (ref 6.0–8.5)

## 2018-02-09 LAB — RUBEOLA ANTIBODY IGG: RUBEOLA AB, IGG: 113 [AU]/ml (ref >16.4)

## 2018-02-09 LAB — MUMPS ANTIBODY, IGG: MUMPS ABS, IGG: 129 AU/mL (ref >10.9)

## 2018-02-13 ENCOUNTER — Ambulatory Visit (INDEPENDENT_AMBULATORY_CARE_PROVIDER_SITE_OTHER): Payer: Federal, State, Local not specified - PPO | Admitting: Family Medicine

## 2018-02-13 DIAGNOSIS — Z889 Allergy status to unspecified drugs, medicaments and biological substances status: Secondary | ICD-10-CM

## 2018-03-20 ENCOUNTER — Ambulatory Visit (INDEPENDENT_AMBULATORY_CARE_PROVIDER_SITE_OTHER): Payer: Federal, State, Local not specified - PPO | Admitting: Family Medicine

## 2018-03-20 VITALS — BP 125/80 | HR 65 | Temp 97.7°F | Resp 14

## 2018-03-20 DIAGNOSIS — J069 Acute upper respiratory infection, unspecified: Secondary | ICD-10-CM

## 2018-03-20 MED ORDER — AZITHROMYCIN 250 MG PO TABS: ORAL_TABLET | ORAL | 0 refills | Status: AC

## 2018-03-20 NOTE — Progress Notes (Signed)
Patient presents today with symptoms of nasal congestion, sinus pressure, mild sore throat, nonproductive cough.  Patient states that she has had the symptoms for the last 4 days.  She denies any fever, chills, night sweats, myalgias, ear pain, severe headache.  She has started to take a nasal decongestant and Tylenol.  She is traveling with the volleyball team on a plane tomorrow.  Patient states that she typically starts to feel like this before developing a sinus infection.  ROS: Negative except mentioned above. Vitals as per Epic.  GENERAL: NAD HEENT: mild pharyngeal erythema, no exudate, no erythema of TMs, mild maxillary sinus tenderness, no cervical LAD RESP: CTA B CARD: RRR NEURO: CN II-XII grossly intact   A/P: URI - can start Z-Pak in a few days if symptoms persist/worsen, continue antihistamine/decongestant, NSAIDs/Tylenol as needed, rest, hydration, cough suppressant if needed, seek medical attention if symptoms persist or worsen as discussed.  Can continue athletic activity and go to class if afebrile.

## 2018-04-05 ENCOUNTER — Ambulatory Visit: Payer: Federal, State, Local not specified - PPO | Admitting: Family Medicine

## 2018-04-05 ENCOUNTER — Ambulatory Visit (INDEPENDENT_AMBULATORY_CARE_PROVIDER_SITE_OTHER): Payer: Federal, State, Local not specified - PPO | Admitting: Family Medicine

## 2018-04-05 DIAGNOSIS — Z0289 Encounter for other administrative examinations: Secondary | ICD-10-CM

## 2018-04-05 NOTE — Progress Notes (Signed)
Patient presents today to review PHQ 9.  Patient is unsure why she is here.  She was sent by her athletic trainer.  Her score on her PHQ 9 was higher a few months ago then today.  She denies any depression or anxiety symptoms.  She denies any thoughts of suicide or homicide.  She marked a 1 for feeling tired or having little energy today on her PHQ 9.  She feels this is related to her last week of classes and exams next week.  She admits to normal appetite and sleep currently.  She states that this changes usually throughout the volleyball season.  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  She admits to having a good support system with family and friends.  She has no concerns today.  She is unsure right now about her future after graduating after the spring.  She has thoughts about possibly playing volleyball overseas.  ROS: Negative except mentioned above. Vitals as per Epic. GENERAL: NAD, normal affect, good eye contact NEURO: CN II-XII grossly intact   A/P: Review a PHQ 9 form -no significant concerns today after reviewing PHQ 9, patient knows to seek help if she has any problems with depression or anxiety, appears to have good family/social network, follow-up as needed.

## 2018-04-10 ENCOUNTER — Ambulatory Visit (INDEPENDENT_AMBULATORY_CARE_PROVIDER_SITE_OTHER): Payer: Federal, State, Local not specified - PPO | Admitting: Family Medicine

## 2018-04-10 DIAGNOSIS — Z889 Allergy status to unspecified drugs, medicaments and biological substances status: Secondary | ICD-10-CM

## 2018-06-07 ENCOUNTER — Ambulatory Visit: Payer: Federal, State, Local not specified - PPO | Admitting: Family Medicine

## 2018-06-12 ENCOUNTER — Ambulatory Visit (INDEPENDENT_AMBULATORY_CARE_PROVIDER_SITE_OTHER): Payer: Federal, State, Local not specified - PPO | Admitting: Family Medicine

## 2018-06-12 DIAGNOSIS — Z889 Allergy status to unspecified drugs, medicaments and biological substances status: Secondary | ICD-10-CM

## 2019-02-28 ENCOUNTER — Encounter (INDEPENDENT_AMBULATORY_CARE_PROVIDER_SITE_OTHER): Payer: Self-pay

## 2019-03-29 ENCOUNTER — Ambulatory Visit (INDEPENDENT_AMBULATORY_CARE_PROVIDER_SITE_OTHER): Payer: Self-pay | Admitting: Rheumatology

## 2019-04-05 ENCOUNTER — Ambulatory Visit (INDEPENDENT_AMBULATORY_CARE_PROVIDER_SITE_OTHER): Payer: BLUE CROSS/BLUE SHIELD | Admitting: Rheumatology

## 2019-04-05 ENCOUNTER — Encounter (INDEPENDENT_AMBULATORY_CARE_PROVIDER_SITE_OTHER): Payer: Self-pay | Admitting: Rheumatology

## 2019-04-05 DIAGNOSIS — M0880 Other juvenile arthritis, unspecified site: Secondary | ICD-10-CM

## 2019-04-05 NOTE — Patient Instructions (Signed)
Your feedback about the services we provided today in Rheumatology is important to [place].  You will receive a confidential survey via e-mail (check your spam/junk folder) or mail the next Wednesday after today's visit.  Please take a moment to complete it to let us know what we got right and where we can work to improve.    If you haven't already, Please sign up for MyChart.  The messaging feature of MyChart is the best way to communicate with me and my team to get test results, medication refills, or ask questions.    Here are things I'd like you to do following today's visit:  Please have blood taken for labs - any [facility].

## 2019-04-05 NOTE — Progress Notes (Signed)
Rheumatology New Patient Note    PCP: Pcp, None, MD    Jane Mccormick is a 23 y.o. female who presents to the rheumatology clinic for initial evaluation of polyarthralgia    Chief Complaint   Patient presents with   . Consult (Initial)     Self Referred, DX JIA 2013, lagging flare left ankle/foot, pain level 5/10     HPI:    Jane Mccormick presents for history of JIA and is transitioning from Pediatric to Adult Rheumatology care.  Previously treated at [facility]. Summary of records below.    Overall doing well but recently having recurrent joint pain and >30 minutes of stiffness in the left ankle. Symptoms off and on over last few months. Aggravated by running, playing volleyball. Alleviated by Naproxen. Long term Treatments notable for Actemra and methotrexate. Recent trial of reducing methotrexate which temporally is associated with increased ankle symptoms.    Her family is in [place], [place]. 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    The following sections were reviewed this encounter by the provider:   Tobacco  Allergies  Meds  Problems  Med Hx  Surg Hx  Fam Hx         PMH/PSH:  Past Medical History:   Diagnosis Date   . JIA (juvenile idiopathic arthritis)    . Other accident 2016    arthrocentesis of the jaw         Past Surgical History:   Procedure Laterality Date   . TONSILLECTOMY  2009   . WISDOM TOOTH EXTRACTION          FH/SH:  Family History   Problem Relation Age of Onset   . No known problems Mother    . No known problems Father        Social History     Socioeconomic History   . Marital status: Single     Spouse name: Not on file   . Number of children: Not on file   . Years of education: Not on file   . Highest education level: Not on file   Occupational History   . Not on file   Social Needs   . Financial resource strain: Not on file   . Food insecurity     Worry: Not on file     Inability: Not on file   . Transportation needs     Medical: Not on file      Non-medical: Not on file   Tobacco Use   . Smoking status: Never Smoker   . Smokeless tobacco: Never Used   Substance and Sexual Activity   . Alcohol use: Yes     Comment: occ   . Drug use: Never   . Sexual activity: Not on file   Lifestyle   . Physical activity     Days per week: Not on file     Minutes per session: Not on file   . Stress: Not on file   Relationships   . Social Wellsite geologist on phone: Not on file     Gets together: Not on file     Attends religious service: Not on file     Active member of club or organization: Not on file     Attends meetings of clubs or organizations: Not on file     Relationship status: Not on file   . Intimate partner violence  Fear of current or ex partner: Not on file     Emotionally abused: Not on file     Physically abused: Not on file     Forced sexual activity: Not on file   Other Topics Concern   . Not on file   Social History Narrative   . Not on file        Meds/ Allergies:  Outpatient Medications Marked as Taking for the 04/05/19 encounter (Office Visit) with Lawrence Marseilles, MD   Medication Sig Dispense Refill   . Methotrexate Sodium (methotrexate, PF,) 1 g injection Infuse into the vein     . naproxen (NAPROSYN) 500 MG tablet Take 500 mg by mouth 2 (two) times daily with meals     . Tocilizumab (ACTEMRA IV) Infuse into the vein       No Known Allergies    Review of Systems   Constitutional: Negative for chills and fever.   HENT: Negative for nosebleeds and sinus pain.    Eyes: Negative for blurred vision, double vision and redness.   Respiratory: Negative for cough and hemoptysis.    Cardiovascular: Negative for chest pain and palpitations.   Gastrointestinal: Negative for nausea and vomiting.   Genitourinary: Negative for dysuria.   Musculoskeletal: Positive for joint pain. Negative for myalgias.   Skin: Positive for rash. Negative for itching.   Neurological: Negative for tingling and headaches.   Endo/Heme/Allergies: Does not bruise/bleed easily.    Psychiatric/Behavioral: The patient does not have insomnia.    All other systems reviewed and are negative.     PHYSICAL EXAM  Vitals:    04/05/19 1302   BP: 124/76   Pulse: 64   Temp:       General- WNWD, alert and oriented, NAD    Eyes- EOMI, PERRL, no conjunctival injection, no scleral icterus    ENMT- face symmetric without lesions, OP clear, no oral ulcers    Heme/Lymph/Immuno- no cervical LAD    CV- nml s1s2 without murmurs    Resp- nml effort, CTA bilat, no wheezes or crackles     Skin- no rash, no alopecia, no nail pitting, no onycholysis     Ext- no peripheral edema, no clubbing, no cyanosis    MSK:   Neck-normal flexion, extension, and rotation of cervical spine  Shoulders-normal   Elbows-no swelling or tenderness, normal ROM, no warmth  Wrists- no swelling or tenderness, normal ROM, no warmth    MCPJ Left and Right Hand- no swelling or tenderness, normal ROM  PIPJ Left and Right Hand- no swelling or tenderness normal ROM  DIPJ Left and Right Hand -  no swelling or tenderness, normal ROM    Hips-normal flexion, internal and external rotation bilaterally, no trochanteric tenderness  Knees- no crepitus, warmth, swelling or tenderness. Normal ROM.     Ankles-normal flexion and extension, inversion and eversion. No swelling or tenderness.  Feet-no MTPJ swelling, tenderness or warmth.    LABS:  No results found for: WBC, HGB, HCT, MCV, PLT   No results found for: CREAT, BUN, NA, K, CL, CO2   No results found for: ALT, AST, GGT, ALKPHOS, BILITOTAL   No results found for: ESR   No results found for: CRP   No results found for: ANA   No results found for: RHEUMFACTOR   No results found for: URICACID  Normal CRP, CBC, CMP 11/30/2018 from outside record review.    RADIOLOGY:    No pertinent radiology results are available for review.  Review/Summary of Outside records - Dr. Mikey Bussing [facility], Pediatric Rheum.  Pt dx 2013 with onset of arthritis ankles,knees, hips, wrists, elbows. Initial treatment with  prednisone with improvement. Ultimately methotrexate and then humira were added. 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 2016 Humira was switched to Enbrel. She did well on this regimen. She had a flare 2017 related to [employer] [facility]. And was treated with prednisone. Enbrel was changed to Actemra 2018 due to a recurrent flare. Did well on this until summer 2020 when trial of reduced methotrexate seemed to lead to increased ankle symptoms. Switch back to full dose methotrexate was made approx Oct 2020 timeframe.    ASSESSMENT/PLAN:    1. JIA (juvenile idiopathic arthritis)  - CBC and differential; Future  - C Reactive Protein; Future  - Comprehensive metabolic panel; Future     Continue methotrexate and actemra with naproxen as needed for symptoms.   Need to update labs - orders provided for [facility].    Return in about 3 months (around 07/04/2019) for Rheumatoid arthritis.      PATIENT INSTRUCTIONS:  Patient Instructions   Your feedback about the services we provided today in Rheumatology is important to [place].  You will receive a confidential survey via e-mail (check your spam/junk folder) or mail the next Wednesday after today's visit.  Please take a moment to complete it to let us know what we got right and where we can work to improve.    If you haven't already, Please sign up for MyChart.  The messaging feature of MyChart is the best way to communicate with me and my team to get test results, medication refills, or ask questions.    Here are things I'd like you to do following today's visit:  Please have blood taken for labs - any [facility].             Marnee Guarneri, MD, Jerrel Ivory, [facility]  Rheumatologist  [address]  [address]   [place], [place] [address]  P [phone]  F [phone]

## 2019-04-22 ENCOUNTER — Other Ambulatory Visit (INDEPENDENT_AMBULATORY_CARE_PROVIDER_SITE_OTHER): Payer: Self-pay | Admitting: Rheumatology

## 2019-04-23 LAB — CBC AND DIFFERENTIAL
Baso(Absolute): 32 cells/uL (ref 0–200)
Basophils: 0.6 %
Eosinophils Absolute: 371 cells/uL (ref 15–500)
Eosinophils: 7 %
Hematocrit: 42.7 % (ref 35.0–45.0)
Hemoglobin: 14.7 g/dL (ref 11.7–15.5)
Lymphocytes Absolute: 1484 cells/uL (ref 850–3900)
Lymphocytes: 28 %
MCH: 32.5 pg (ref 27.0–33.0)
MCHC: 34.4 g/dL (ref 32.0–36.0)
MCV: 94.5 fL (ref 80.0–100.0)
MPV: 11 fL (ref 7.5–12.5)
Monocytes Absolute: 509 cells/uL (ref 200–950)
Monocytes: 9.6 %
Neutrophils Absolute: 2904 cells/uL (ref 1500–7800)
Neutrophils: 54.8 %
Platelets: 213 10*3/uL (ref 140–400)
RBC: 4.52 10*6/uL (ref 3.80–5.10)
RDW: 12.3 % (ref 11.0–15.0)
WBC: 5.3 10*3/uL (ref 3.8–10.8)

## 2019-04-23 LAB — COMPREHENSIVE METABOLIC PANEL
ALT: 12 U/L (ref 6–29)
AST (SGOT): 17 U/L (ref 10–30)
Albumin/Globulin Ratio: 2.2 (calc) (ref 1.0–2.5)
Albumin: 4.6 g/dL (ref 3.6–5.1)
Alkaline Phosphatase: 39 U/L (ref 31–125)
BUN: 16 mg/dL (ref 7–25)
Bilirubin, Total: 1.1 mg/dL (ref 0.2–1.2)
CO2: 29 mmol/L (ref 20–32)
Calcium: 9.9 mg/dL (ref 8.6–10.2)
Chloride: 101 mmol/L (ref 98–110)
Creatinine: 0.96 mg/dL (ref 0.50–1.10)
EGFR African American: 97 mL/min/{1.73_m2} (ref >=60)
Globulin: 2.1 g/dL (calc) (ref 1.9–3.7)
Glucose: 141 mg/dL — ABNORMAL HIGH (ref 65–139)
NON-AFRICAN AMERICA EGFR: 84 mL/min/{1.73_m2} (ref >=60)
Potassium: 3.7 mmol/L (ref 3.5–5.3)
Protein, Total: 6.7 g/dL (ref 6.1–8.1)
Sodium: 138 mmol/L (ref 135–146)

## 2019-04-23 LAB — C-REACTIVE PROTEIN: C-Reactive Protein: 0.9 mg/L (ref <8.0)

## 2019-07-05 ENCOUNTER — Encounter (INDEPENDENT_AMBULATORY_CARE_PROVIDER_SITE_OTHER): Payer: Self-pay | Admitting: Rheumatology

## 2019-07-05 ENCOUNTER — Ambulatory Visit (INDEPENDENT_AMBULATORY_CARE_PROVIDER_SITE_OTHER): Payer: BLUE CROSS/BLUE SHIELD | Admitting: Rheumatology

## 2019-07-05 VITALS — BP 119/75 | HR 75 | Temp 98.7°F | Ht 72.0 in | Wt 186.0 lb

## 2019-07-05 DIAGNOSIS — M0880 Other juvenile arthritis, unspecified site: Secondary | ICD-10-CM

## 2019-07-05 DIAGNOSIS — Z79899 Other long term (current) drug therapy: Secondary | ICD-10-CM

## 2019-07-05 DIAGNOSIS — M41124 Adolescent idiopathic scoliosis, thoracic region: Secondary | ICD-10-CM | POA: Insufficient documentation

## 2019-07-05 NOTE — Progress Notes (Signed)
[place] Rheumatology Follow-up    PCP: Pcp, None, MD    Chief Complaint   Patient presents with   . JIA (juvenile idiopathic arthritis)     Follow up- back pain (slight scoliosis),going to PT      Jane Mccormick is a 24 y.o. female who presents to the rheumatology clinic for follow-up.     HPI:  Doing well on methotrexate and actemra.  No injection reactions.  No rash. Coldness of hands/feet without color change.  Working with physical therapy re: scoliosis and leg length discrepancy.    The following sections were reviewed this encounter by the provider:   Tobacco  Allergies  Meds  Problems  Med Hx  Surg Hx  Fam Hx         PMH/PSH:  Past Medical History:   Diagnosis Date   . JIA (juvenile idiopathic arthritis)    . Other accident 2016    arthrocentesis of the jaw         Meds/ Allergies:  Outpatient Medications Marked as Taking for the 07/05/19 encounter (Office Visit) with Lawrence Marseilles, MD   Medication Sig Dispense Refill   . Methotrexate Sodium (methotrexate, PF,) 1 g injection Infuse into the vein     . naproxen (NAPROSYN) 500 MG tablet Take 500 mg by mouth 2 (two) times daily with meals     . Tocilizumab (ACTEMRA IV) Infuse into the vein       No Known Allergies    REVIEW OF SYSTEMS   Review of Systems   Gastrointestinal: Negative for nausea and vomiting.   Skin: Negative for itching and rash.        PHYSICAL EXAM  Vitals:    07/05/19 1042   BP: 119/75   Pulse: 75   Temp: 98.7 F (37.1 C)        General appearance:   WNWD, NAD, alert and oriented  Normal ROM of hands, wrists, elbows.  No Raynaud's      LABS:  Lab Results   Component Value Date    WBC 5.3 04/22/2019    HGB 14.7 04/22/2019    HCT 42.7 04/22/2019    MCV 94.5 04/22/2019    PLT 213 04/22/2019      Lab Results   Component Value Date    CREAT 0.96 04/22/2019    BUN 16 04/22/2019    NA 138 04/22/2019    K 3.7 04/22/2019    CL 101 04/22/2019    CO2 29 04/22/2019      Lab Results   Component Value Date    ALT 12 04/22/2019    AST 17 04/22/2019     ALKPHOS 39 04/22/2019    BILITOTAL 1.1 04/22/2019      No results found for: ESR   Lab Results   Component Value Date    CRP 0.9 04/22/2019      No results found for: URICACID    ASSESSMENT / PLAN    1. JIA (juvenile idiopathic arthritis)  - CBC and differential; Future  - C Reactive Protein; Future  - Comprehensive metabolic panel; Future    2. Adolescent idiopathic scoliosis of thoracic region    3. High risk medication use    JIA - clinical remission. Cont methotrexate and Actemra.  Update labs. Consider reducing methotrexate at next visit if she continues to do well.  Has had COVID vaccine series.    Return in about 3 months (around 10/05/2019).      PATIENT INSTRUCTIONS:  There are no Patient Instructions on file for this visit.       Ephriam Knuckles, MD, [place], [facility] Rheumatologist  [address] [place]  [place], [place] [address]  [place] [phone]  F [phone]

## 2019-08-28 ENCOUNTER — Telehealth (INDEPENDENT_AMBULATORY_CARE_PROVIDER_SITE_OTHER): Payer: Self-pay | Admitting: Rheumatology

## 2019-08-28 DIAGNOSIS — M0880 Other juvenile arthritis, unspecified site: Secondary | ICD-10-CM

## 2019-08-28 NOTE — Telephone Encounter (Signed)
Pt requesting Methotrexate injection to be sent to her mail order pharmacy on file.

## 2019-08-28 NOTE — Telephone Encounter (Signed)
Spoke to Dr. Mellody Dance. Spoke to patient, to obtain dosaging of Methotrexate that patient is taking. Pt states she is taking Methotrexate injectable; does not remember dosing at time of call but stated she will be reaching out to her [facility] Rheumatology nurse and will CB when she has info

## 2019-09-02 ENCOUNTER — Telehealth (INDEPENDENT_AMBULATORY_CARE_PROVIDER_SITE_OTHER): Payer: Self-pay

## 2019-09-02 MED ORDER — ACTEMRA ACTPEN 162 MG/0.9ML SC SOAJ
162.00 mg | SUBCUTANEOUS | 11 refills | Status: DC
Start: 2019-09-02 — End: 2020-01-27

## 2019-09-02 MED ORDER — METHOTREXATE (PF) 25 MG/0.5ML SC SOAJ
25.00 mg | SUBCUTANEOUS | 11 refills | Status: DC
Start: 2019-09-02 — End: 2020-03-12

## 2019-09-02 NOTE — Telephone Encounter (Signed)
PA :   . Received message via PRESCRIBER  . Requesting prior authorization NGE:[id number]   . Quantity of 4 PENS for a day supply of 28  . PA Approved or Denied:APPROVED   . Insurance: [id number]  . Approval# [id number]  . Dates Approval: 08/03/19-09/01/20      . Pharmacy and Ph# : Johny Sax [phone]  . Copay: EST $70  . Additional Notes:PA FOR ACTEMRA ON FILE UNTIL 11/27/20

## 2019-09-02 NOTE — Telephone Encounter (Signed)
Spoke to patient and verified dosaging. Pt states she has been taking the following medications/injectables and would like to request refills to [facility]    - Methotrexate injectable 25mg  weekly  - Actemra 162mg  SQ weekly

## 2019-09-02 NOTE — Telephone Encounter (Signed)
Returned call to patient from Voicemail, who stated she had more info RE: MTX injectable dosage for needed refill    LVMTCB - OK to leave detailed voicemail on Nurse line

## 2019-09-03 NOTE — Telephone Encounter (Signed)
Called patient. LVM to notify RE: Insurance and Darol Destine for assistance with insurance PA's

## 2019-09-09 ENCOUNTER — Other Ambulatory Visit (INDEPENDENT_AMBULATORY_CARE_PROVIDER_SITE_OTHER): Payer: Self-pay

## 2019-09-09 DIAGNOSIS — M0880 Other juvenile arthritis, unspecified site: Secondary | ICD-10-CM

## 2019-09-09 MED ORDER — NEEDLES & SYRINGES MISC
25.00 mg | 3 refills | Status: DC
Start: 2019-09-09 — End: 2021-10-04

## 2019-09-09 MED ORDER — METHOTREXATE SODIUM 50 MG/2ML IJ SOLN
25.00 mg | Freq: Once | INTRAMUSCULAR | 0 refills | Status: AC
Start: 2019-09-09 — End: 2019-09-09

## 2019-09-09 NOTE — Telephone Encounter (Signed)
Pt requesting new RX for MTX vials and syringes to be sent to [facility]

## 2019-09-09 NOTE — Telephone Encounter (Signed)
Patient called office stating that methotrexate injections was sent to the wrong pharmacy, and that it was supposed to be sent to [facility].  Informed patient that it was sent to [facility] on 09/02/2019 and that receipt from pharmacy was confirmed.   Patient stated that it was in fact not received by pharmacy, and that name brand only was sent.  Informed patient that rx that was sent to [facility] on 09/02/2019 was for methotrexate and not "rasuvo."  Patient requested that rx be sent again.

## 2019-09-14 IMAGING — MR MR SHOULDER*L* W/O CM
5 series · 40 of 40 positions shown · non-contrast
Comparison: Left shoulder x-rays dated March 27, 2017.

CLINICAL DATA: Acute left shoulder pain after falling on it 3 weeks
ago while playing volleyball.

EXAM:
MRI OF THE LEFT SHOULDER WITHOUT CONTRAST
TECHNIQUE: Multiplanar, multisequence MR imaging of the shoulder was performed.
No intravenous contrast was administered.

[Series 3: T2 fat-sat · axial · 4.0mm · 0.47mm/px · z∈[-79,+11]mm · 8 of 23 slices shown (1 of 3)]
[im 1/23]
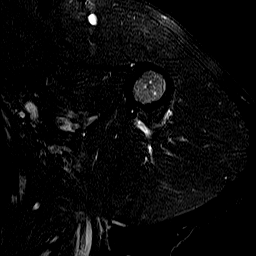
[im 4/23]
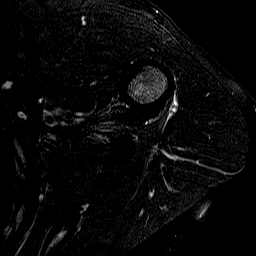
[im 7/23]
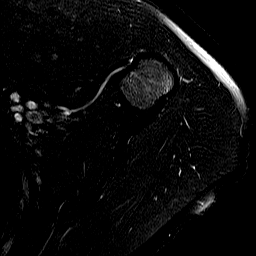
[im 10/23]
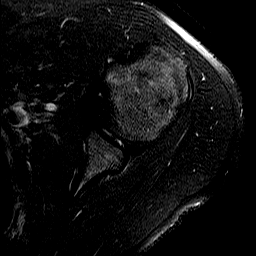
[im 13/23]
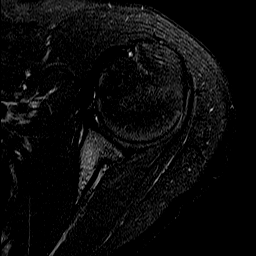
[im 16/23]
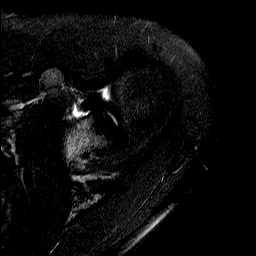
[im 19/23]
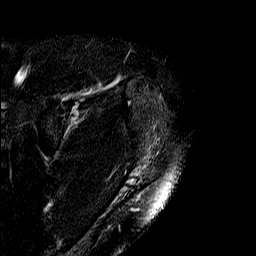
[im 23/23]
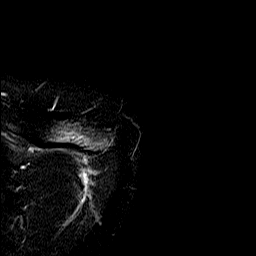

[Series 4: T2 fat-sat · oblique · 4.0mm · 0.62mm/px · 8 of 23 slices shown (2 of 3)]
[im 1/23]
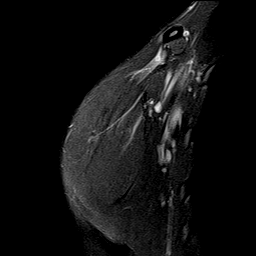
[im 4/23]
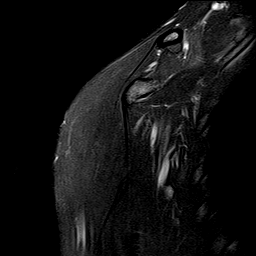
[im 7/23]
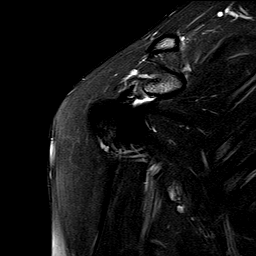
[im 10/23]
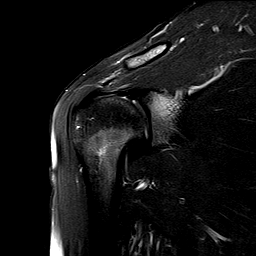
[im 13/23]
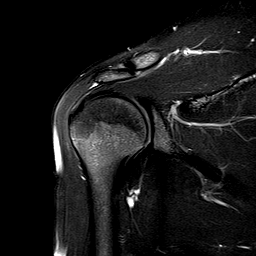
[im 16/23]
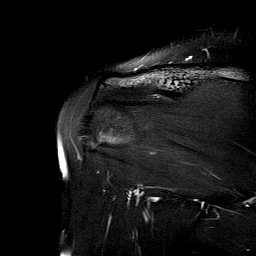
[im 19/23]
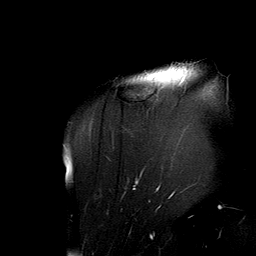
[im 23/23]
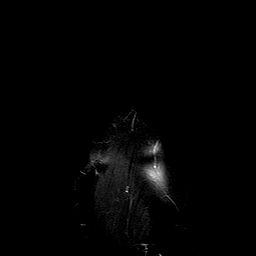

[Series 5: PD · oblique · 4.0mm · 0.62mm/px · 8 of 23 slices shown]
[im 1/23]
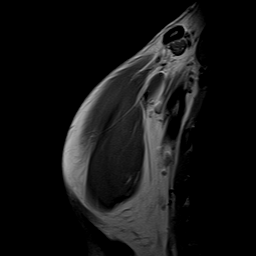
[im 4/23]
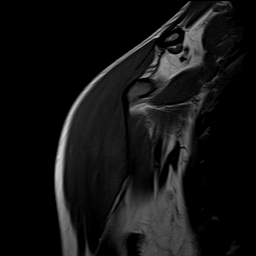
[im 7/23]
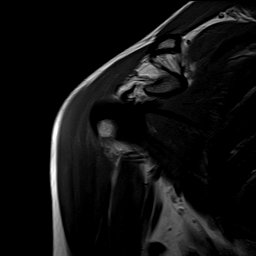
[im 10/23]
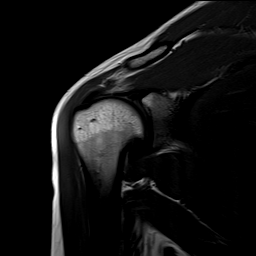
[im 13/23]
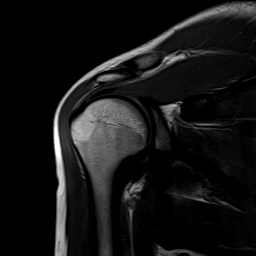
[im 16/23]
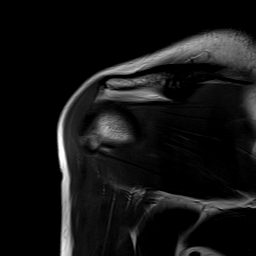
[im 19/23]
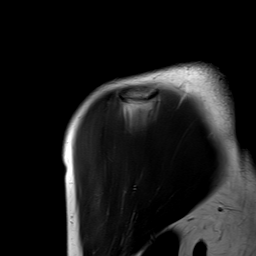
[im 23/23]
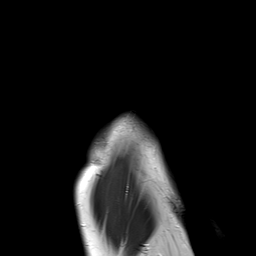

[Series 6: T1 · oblique · 4.0mm · 0.62mm/px · 8 of 22 slices shown]
[im 1/22]
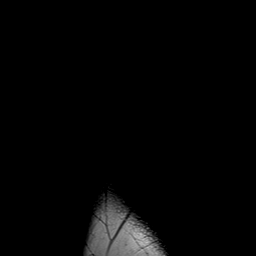
[im 4/22]
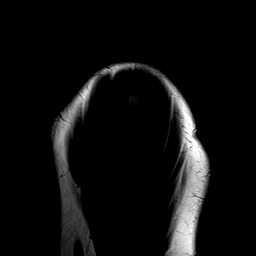
[im 7/22]
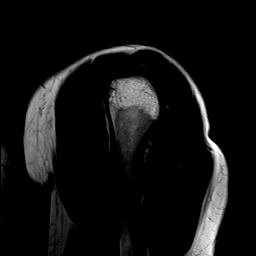
[im 10/22]
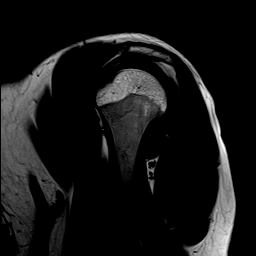
[im 13/22]
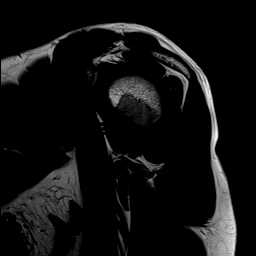
[im 16/22]
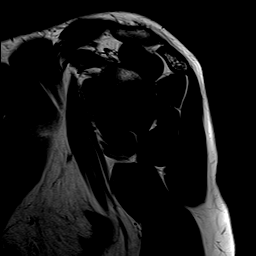
[im 19/22]
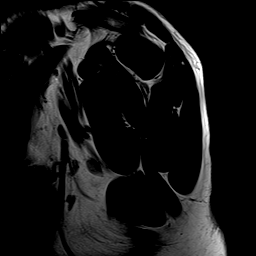
[im 22/22]
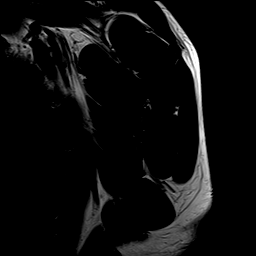

[Series 7: T2 fat-sat · oblique · 4.0mm · 0.62mm/px · 8 of 22 slices shown (3 of 3)]
[im 1/22]
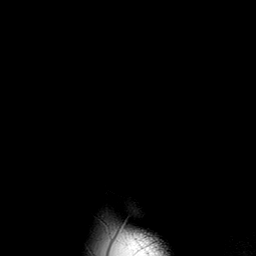
[im 4/22]
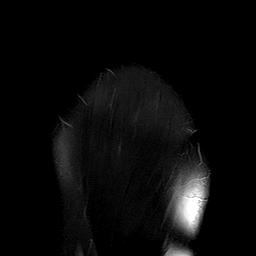
[im 7/22]
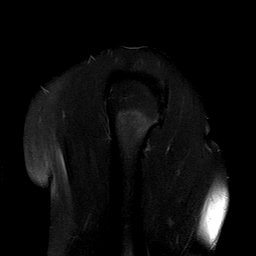
[im 10/22]
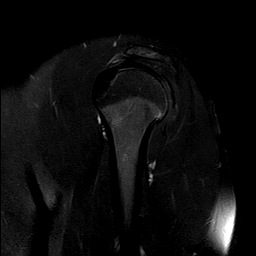
[im 13/22]
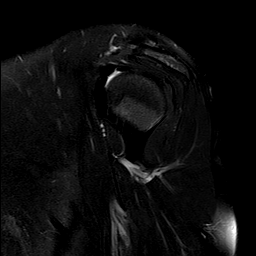
[im 16/22]
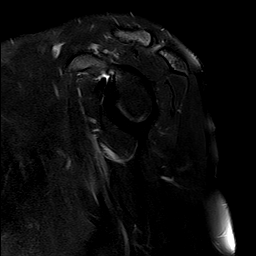
[im 19/22]
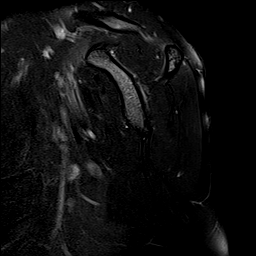
[im 22/22]
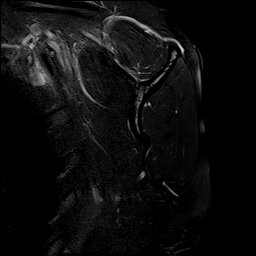

[40 of 40 positions shown; findings below may reference images not displayed]

FINDINGS: Rotator cuff: Intact supraspinatus, infraspinatus, teres minor and
subscapularis tendons. No tendinosis.

Muscles: No atrophy or abnormal signal of the muscles of the rotator
cuff.

Biceps long head:  Intact and normally located.

Acromioclavicular Joint: Normal acromioclavicular joint. Type II
acromion. No subacromial/subdeltoid bursal fluid.

Glenohumeral Joint: No joint effusion. No chondral defect.

Labrum: Grossly intact, but evaluation is limited by lack of
intraarticular fluid.

Bones:  No marrow abnormality, fracture or dislocation.

Other: None.
IMPRESSION: Normal MRI of the left shoulder. No evidence of labral tear,
although evaluation is limited by lack of intra-articular fluid.

## 2019-09-17 ENCOUNTER — Encounter (INDEPENDENT_AMBULATORY_CARE_PROVIDER_SITE_OTHER): Payer: Self-pay | Admitting: Rheumatology

## 2019-09-27 ENCOUNTER — Other Ambulatory Visit (INDEPENDENT_AMBULATORY_CARE_PROVIDER_SITE_OTHER): Payer: Self-pay

## 2019-09-27 NOTE — Telephone Encounter (Signed)
ERROR

## 2019-10-04 ENCOUNTER — Encounter (INDEPENDENT_AMBULATORY_CARE_PROVIDER_SITE_OTHER): Payer: Self-pay | Admitting: Rheumatology

## 2019-10-04 ENCOUNTER — Ambulatory Visit (INDEPENDENT_AMBULATORY_CARE_PROVIDER_SITE_OTHER): Payer: BLUE CROSS/BLUE SHIELD | Admitting: Rheumatology

## 2019-10-04 VITALS — BP 128/82 | HR 74 | Ht 72.0 in | Wt 196.8 lb

## 2019-10-04 DIAGNOSIS — Z7960 Long term (current) use of unspecified immunomodulators and immunosuppressants: Secondary | ICD-10-CM

## 2019-10-04 DIAGNOSIS — M0880 Other juvenile arthritis, unspecified site: Secondary | ICD-10-CM

## 2019-10-04 DIAGNOSIS — Z79899 Other long term (current) drug therapy: Secondary | ICD-10-CM

## 2019-10-04 NOTE — Patient Instructions (Addendum)
Labs   Continue Actemra  Ok to reduce methotrexate by 50%    [facility] in [place] [address]    Sincerely,    Marnee Guarneri, MD, Jerrel Ivory, [facility]  Rheumatologist  [facility]  [phone]

## 2019-10-04 NOTE — Progress Notes (Signed)
[place] Rheumatology Follow-up    PCP: Pcp, None, MD    Chief Complaint   Patient presents with   . JIA (juvenile idiopathic arthritis)     Follow up- stable sx     Jane Mccormick is a 24 y.o. female who presents to the rheumatology clinic for follow-up.     HPI:   Doing well.  On Actemra.  Waiting on methotrexate shipment.  We had discussed reducing methotrexate before and she thinks she is ready to try.  No active joints.  No rash.  No eye pain/redness.  Looking for an orthodontics recommendation.    The following sections were reviewed this encounter by the provider:   Tobacco  Allergies  Meds  Problems  Med Hx  Surg Hx  Fam Hx         PMH/PSH:  Past Medical History:   Diagnosis Date   . JIA (juvenile idiopathic arthritis)    . Other accident 2016    arthrocentesis of the jaw         Meds/ Allergies:  Outpatient Medications Marked as Taking for the 10/04/19 encounter (Office Visit) with Lawrence Marseilles, MD   Medication Sig Dispense Refill   . Methotrexate Sodium (methotrexate, PF,) 1 g injection Infuse into the vein     . Methotrexate, PF, 25 MG/0.5ML Solution Auto-injector Inject 25 mg into the skin once a week 4 pen 11   . Needles & Syringes Misc 25 mg by Does not apply route once a week Use for weekly MTX injections 12 each 3   . Tocilizumab (Actemra ACTPen) 162 MG/0.9ML Solution Auto-injector Inject 162 mg into the skin once a week 4 pen 11     No Known Allergies    REVIEW OF SYSTEMS   Review of Systems   Constitutional: Negative for chills and fever.   Gastrointestinal: Negative for nausea and vomiting.        PHYSICAL EXAM  Vitals:    10/04/19 0825   BP: 128/82   Pulse: 74        General appearance:   WNWD, NAD, alert and oriented  No scleral injection  No rash  No swollen/tender joints - elbows, hands, wrists, knees    LABS:  Lab Results   Component Value Date    WBC 5.3 04/22/2019    HGB 14.7 04/22/2019    HCT 42.7 04/22/2019    MCV 94.5 04/22/2019    PLT 213 04/22/2019      Lab Results   Component  Value Date    CREAT 0.96 04/22/2019    BUN 16 04/22/2019    NA 138 04/22/2019    K 3.7 04/22/2019    CL 101 04/22/2019    CO2 29 04/22/2019      Lab Results   Component Value Date    ALT 12 04/22/2019    AST 17 04/22/2019    ALKPHOS 39 04/22/2019    BILITOTAL 1.1 04/22/2019      No results found for: ESR   Lab Results   Component Value Date    CRP 0.9 04/22/2019      No results found for: URICACID    ASSESSMENT / PLAN    1. JIA (juvenile idiopathic arthritis)    2. Long-term use of immunosuppressant medication  - CBC and differential  - C Reactive Protein  - Comprehensive metabolic panel  - CBC and differential; Future  - C Reactive Protein; Future  - Comprehensive metabolic panel; Future  JIA: Stable on current DMARDs. Ok to try to reduce methotrexate from 25 mg to 12.5 mg weekly. Continue Actemra at standard dose.    Long Term Med use: Actemra, Methotrexate. Requires lab monitoring every 2-3 months(CBC, CMP).    Return in about 3 months (around 01/04/2020) for Inflammatory Arthritis.      PATIENT INSTRUCTIONS:  Patient Instructions   Labs   Continue Actemra  Ok to reduce methotrexate by 50%    [facility] in [place] [address]    Sincerely,    Marnee Guarneri, MD, FACP, [facility]  Rheumatologist  [facility]  [phone]             Marnee Guarneri, MD, FACP, [facility] Rheumatologist  [address]  [place], [place] [address]  P [phone]  F [phone]

## 2020-01-13 ENCOUNTER — Telehealth (INDEPENDENT_AMBULATORY_CARE_PROVIDER_SITE_OTHER): Payer: BLUE CROSS/BLUE SHIELD | Admitting: Rheumatology

## 2020-01-13 ENCOUNTER — Encounter (INDEPENDENT_AMBULATORY_CARE_PROVIDER_SITE_OTHER): Payer: Self-pay | Admitting: Rheumatology

## 2020-01-13 DIAGNOSIS — M0880 Other juvenile arthritis, unspecified site: Secondary | ICD-10-CM

## 2020-01-13 DIAGNOSIS — Z79899 Other long term (current) drug therapy: Secondary | ICD-10-CM

## 2020-01-13 NOTE — Progress Notes (Signed)
[place] [facility] Telehealth Follow-up    PCP: Pcp, None, MD    Verbal consent has been obtained from the patient to conduct a video and telephone visit to minimize exposure to COVID-19: yes    Chief complaint:   Chief Complaint   Patient presents with   . Arthritis     JIA       Jane Mccormick is a 24 y.o. female who presents to the rheumatology clinic for follow-up.     HPI:   History of JIA.  Treatments: Methotrexate and tocilizumab.  Last visit we reduced the methotrexate to 12.5 mg weekly, down from 25 mg weekly. No change in clinical status following the dose change.    She received her COVID vaccine booster shot.    The following sections were reviewed this encounter by the provider:   Tobacco  Allergies  Meds  Problems  Med Hx  Surg Hx  Fam Hx         PMH/PSH:  Past Medical History:   Diagnosis Date   . JIA (juvenile idiopathic arthritis)    . Other accident 2016    arthrocentesis of the jaw         Meds/ Allergies:  No outpatient medications have been marked as taking for the 01/13/20 encounter (Telemedicine Visit) with Lawrence Marseilles, MD.     No Known Allergies    REVIEW OF SYSTEMS   Review of Systems   Constitutional: Negative for chills and fever.   Eyes: Negative for pain and redness.   Respiratory: Negative for shortness of breath.    Gastrointestinal: Negative for nausea.   Skin: Negative for rash.     PHYSICAL EXAM  There were no vitals filed for this visit.     General appearance:   WNWD, NAD, alert and oriented    Data reviewed this encounter:  Labs:  Lab Results   Component Value Date    WBC 5.3 04/22/2019    HGB 14.7 04/22/2019    HCT 42.7 04/22/2019    MCV 94.5 04/22/2019    PLT 213 04/22/2019      Lab Results   Component Value Date    CREAT 0.96 04/22/2019    BUN 16 04/22/2019    NA 138 04/22/2019    K 3.7 04/22/2019    CL 101 04/22/2019    CO2 29 04/22/2019      Lab Results   Component Value Date    ALT 12 04/22/2019    AST 17 04/22/2019    ALKPHOS 39 04/22/2019    BILITOTAL 1.1  04/22/2019      No results found for: ESR   Lab Results   Component Value Date    CRP 0.9 04/22/2019      No components found for: URIC ACID    ASSESSMENT / PLAN    1. JIA (juvenile idiopathic arthritis)    2. High risk medication use  - CBC and differential; Future  - C Reactive Protein; Future  - Comprehensive metabolic panel; Future    JIA in remission. Cont methotrexate, tocilizumab.  She had covid booster (Moderna).    Long term high risk medication use resulting in immunosuppression: methotrexate.   CBC, CMP every 2-3 months for drug monitoring.    Return in about 3 months (around 04/13/2020).      PATIENT INSTRUCTIONS:  Patient Instructions   Patients can now log into MyChart to review and print their Laboratory, Radiology and Referral Requisition (orders).   You will  receive an email notifying you to login to your MyChart account.   1. Click Messaging   2. Click Letters   3. Select the requisition   4. Review the requisition & click print   Please note, for multiple requisitions, there will be page breaks in between each requisition (order).    I'll send a note when I have your test results.    Sincerely,    Marnee Guarneri, MD, Jerrel Ivory, [facility]  Rheumatologist  [facility]  [phone]

## 2020-01-13 NOTE — Patient Instructions (Signed)
Patients can now log into MyChart to review and print their Laboratory, Radiology and Referral Requisition (orders).   You will receive an email notifying you to login to your MyChart account.   1. Click Messaging   2. Click Letters   3. Select the requisition   4. Review the requisition & click print   Please note, for multiple requisitions, there will be page breaks in between each requisition (order).    I'll send a note when I have your test results.    Sincerely,    Marnee Guarneri, MD, Jerrel Ivory, [facility]  Rheumatologist  [facility]  [phone]

## 2020-01-23 ENCOUNTER — Other Ambulatory Visit (INDEPENDENT_AMBULATORY_CARE_PROVIDER_SITE_OTHER): Payer: Self-pay | Admitting: Rheumatology

## 2020-01-23 DIAGNOSIS — M0880 Other juvenile arthritis, unspecified site: Secondary | ICD-10-CM

## 2020-01-27 ENCOUNTER — Other Ambulatory Visit (INDEPENDENT_AMBULATORY_CARE_PROVIDER_SITE_OTHER): Payer: Self-pay | Admitting: Rheumatology

## 2020-01-27 DIAGNOSIS — M0880 Other juvenile arthritis, unspecified site: Secondary | ICD-10-CM

## 2020-01-28 ENCOUNTER — Encounter (INDEPENDENT_AMBULATORY_CARE_PROVIDER_SITE_OTHER): Payer: Self-pay | Admitting: Rheumatology

## 2020-01-28 LAB — CBC AND DIFFERENTIAL
Baso(Absolute): 41 cells/uL (ref 0–200)
Basophils: 0.8 %
Eosinophils Absolute: 245 cells/uL (ref 15–500)
Eosinophils: 4.8 %
Hematocrit: 41.8 % (ref 35.0–45.0)
Hemoglobin: 14.1 g/dL (ref 11.7–15.5)
Lymphocytes Absolute: 1494 cells/uL (ref 850–3900)
Lymphocytes: 29.3 %
MCH: 31.3 pg (ref 27.0–33.0)
MCHC: 33.7 g/dL (ref 32.0–36.0)
MCV: 92.7 fL (ref 80.0–100.0)
MPV: 11 fL (ref 7.5–12.5)
Monocytes Absolute: 525 cells/uL (ref 200–950)
Monocytes: 10.3 %
Neutrophils Absolute: 2795 cells/uL (ref 1500–7800)
Neutrophils: 54.8 %
Platelets: 197 10*3/uL (ref 140–400)
RBC: 4.51 10*6/uL (ref 3.80–5.10)
RDW: 11.8 % (ref 11.0–15.0)
WBC: 5.1 10*3/uL (ref 3.8–10.8)

## 2020-01-28 LAB — COMPREHENSIVE METABOLIC PANEL
ALT: 14 U/L (ref 6–29)
AST (SGOT): 21 U/L (ref 10–30)
Albumin/Globulin Ratio: 2.3 (calc) (ref 1.0–2.5)
Albumin: 4.4 g/dL (ref 3.6–5.1)
Alkaline Phosphatase: 37 U/L (ref 31–125)
BUN: 11 mg/dL (ref 7–25)
Bilirubin, Total: 0.9 mg/dL (ref 0.2–1.2)
CO2: 30 mmol/L (ref 20–32)
Calcium: 9.6 mg/dL (ref 8.6–10.2)
Chloride: 103 mmol/L (ref 98–110)
Creatinine: 0.95 mg/dL (ref 0.50–1.10)
EGFR African American: 98 mL/min/{1.73_m2} (ref >=60)
Globulin: 1.9 g/dL (calc) (ref 1.9–3.7)
Glucose: 135 mg/dL — ABNORMAL HIGH (ref 65–99)
NON-AFRICAN AMERICA EGFR: 84 mL/min/{1.73_m2} (ref >=60)
Potassium: 4.2 mmol/L (ref 3.5–5.3)
Protein, Total: 6.3 g/dL (ref 6.1–8.1)
Sodium: 138 mmol/L (ref 135–146)

## 2020-01-28 LAB — C-REACTIVE PROTEIN: C-Reactive Protein: 0.4 mg/L (ref <8.0)

## 2020-01-28 MED ORDER — ACTEMRA ACTPEN 162 MG/0.9ML SC SOAJ
162.00 mg | SUBCUTANEOUS | 11 refills | Status: DC
Start: 2020-01-28 — End: 2020-01-29

## 2020-01-28 NOTE — Telephone Encounter (Signed)
[facility] calling to request Rx refill of Actemra for patient    LOV: 01/13/20  Last Labs: 01/27/20  F/up: 04/13/20

## 2020-01-29 ENCOUNTER — Other Ambulatory Visit (INDEPENDENT_AMBULATORY_CARE_PROVIDER_SITE_OTHER): Payer: Self-pay

## 2020-01-29 DIAGNOSIS — M0880 Other juvenile arthritis, unspecified site: Secondary | ICD-10-CM

## 2020-01-29 MED ORDER — TOCILIZUMAB 162 MG/0.9ML SC SOSY
162.00 mg | PREFILLED_SYRINGE | SUBCUTANEOUS | 11 refills | Status: DC
Start: 2020-01-29 — End: 2020-04-13

## 2020-01-29 NOTE — Telephone Encounter (Signed)
Pt request Actemra syringe

## 2020-01-30 ENCOUNTER — Encounter (INDEPENDENT_AMBULATORY_CARE_PROVIDER_SITE_OTHER): Payer: Self-pay | Admitting: Residents

## 2020-01-30 ENCOUNTER — Ambulatory Visit (INDEPENDENT_AMBULATORY_CARE_PROVIDER_SITE_OTHER): Payer: BLUE CROSS/BLUE SHIELD | Admitting: Residents

## 2020-01-30 VITALS — BP 129/84 | HR 67 | Temp 98.0°F | Ht 72.0 in | Wt 200.0 lb

## 2020-01-30 DIAGNOSIS — Z113 Encounter for screening for infections with a predominantly sexual mode of transmission: Secondary | ICD-10-CM

## 2020-01-30 DIAGNOSIS — Z23 Encounter for immunization: Secondary | ICD-10-CM

## 2020-01-30 DIAGNOSIS — R7309 Other abnormal glucose: Secondary | ICD-10-CM

## 2020-01-30 DIAGNOSIS — Z0000 Encounter for general adult medical examination without abnormal findings: Secondary | ICD-10-CM

## 2020-01-30 DIAGNOSIS — R7303 Prediabetes: Secondary | ICD-10-CM

## 2020-01-30 DIAGNOSIS — R011 Cardiac murmur, unspecified: Secondary | ICD-10-CM

## 2020-01-30 LAB — POCT HEMOGLOBIN A1C: POCT Hgb A1C: 6.2 % — AB (ref 3.9–5.9)

## 2020-01-30 NOTE — Patient Instructions (Addendum)
Please get me a copy of your last echocardiogram.    Today you were found to have prediabetes.  Please work on reducing the amount of sugar that you eat.  In this packet you will find information on the Mediterranean diet, which will help you achieve better overall health.  Try to make half of your plate vegetables, limit sugary drinks, and limit the amount of sugary snacks you have in your house.    Your hemoglobin A1c, or 75-month average of blood sugar, is 6.2%. Prediabetes is defined as an A1c of 5.7% to 6.4% diabetes is defined as an A1c of 6.5% or higher.  We usually start treating diabetes with medications when the A1c is 7.0% or higher.    You received your flu shot today.    Please follow-up as scheduled with gynecology for your Pap smears for cervical cancer screening.  Based on the medication that you are on for JIA, you may require more frequent Pap smears than the average woman.    Continue taking a daily vitamin that has at least 4 mg of folic acid.    Your blood pressure was slightly elevated today.  We will recheck this when you return in 3 months for your follow-up visit.  In the meantime, please check your blood pressure periodically and keep a log on your phone that we can review at your follow-up visit.        Understanding the Mediterranean Diet  A Mediterranean-style diet is a healthy way of eating, not a specific diet to lose weight. It includes a lot of foods from plants such as vegetables, fruits, and whole grains, plus olive oil and seafood. It also includes dairy foods and meats, but in smaller amounts. And it includes a moderate amount of wine. Many studies over time have shown health benefits to eating this way. It focuses on making fresh food that's full of flavor.  This plan of eating is inspired by how people eat in countries around the [employer]. They include [place], [place], [place], [place], [place], and [place]. But it uses foods you can buy in almost any grocery store.  Health  benefits of a Mediterranean diet  This diet is high in fiber, lean protein, and healthy oils. It's low in saturated fats and sugar. The diet has been shown to help prevent or manage:   Depression   Diabetes   Heart disease   High blood pressure   Parkinson disease   Alzheimer disease   Cancers of the colon, prostate, and breast  What do I eat on a Mediterranean diet?  Plan each meal around vegetables and whole grains. Use olive oil. Add nuts and legumes. Include fish or lean protein. Foods to focus your meals around include:  Food type What to eat   Vegetables This includes leafy greens, tomatoes, squash, peppers, cucumbers, green beans, eggplant, avocados, potatoes, and olives. You can use fresh or frozen vegetables.   Fruits This includes apples, raspberries, strawberries, grapes, citrus fruit such as oranges and grapefruit, stone fruit such as apricots and peaches, plus figs, dates, and melon.   Whole grains This includes brown rice, whole oats, quinoa, millet, whole grain bread, whole-wheat pasta, and crackers made with whole grains.   Beans and legumes These include lentils, chickpeas, and beans such as pinto, fava, kidney, and black beans. Peanuts are also legumes.   Seafood This includes fish such as salmon, trout, mackerel, haddock, tuna, sardines, anchovies, and whitefish. It also includes shellfish such as  shrimp, oysters, mussels, and clams.   Nuts and seeds These include walnuts, almonds, sunflower seeds, cashews, [place] nuts, and pecans.   Healthy oil Olive oil is the most common oil in the Mediterranean diet. But other healthy oils are canola, sunflower, safflower, and corn oils.   Herbs and spices Season food with oregano, pepper, sage, tarragon, thyme, basil, cinnamon, and cumin.   Wine Enjoy a glass of wine each day with a meal. Skip this if you need to not have alcohol for any reason.   Foods to eat in smaller amounts  You can add these foods in a few days a week, in smaller amounts:    Dairy foods, such as cheese, yogurt, and butter   Poultry, such as chicken and duck   Eggs  Occasional treats  Limit these foods in your weekly eating plan:   Red meats, such as beef, lamb, and pork   Refined grains, such as white rice and foods made with white flour   Sugary treats, such as chocolate, candy, or pastries  Adding lots of flavor  You can liven up fresh foods with many kinds of flavor. Try these sauces, dips, and seasonings:   Hummus   Marinara sauce   Salsa   Vinaigrette dressing  Tips for eating out  At restaurants:   Skip fried foods. These have a lot of saturated fat.   Look for fish dishes that are cooked without cream or butter.   Pick salads that have nuts and seeds.   Choose vegetarian options that don't have too much cheese.  StayWell last reviewed this educational content on 08/31/2018   2000-2021 [employer], [place]. All rights reserved. This information is not intended as a substitute for professional medical care. Always follow your healthcare professional's instructions.

## 2020-01-30 NOTE — Progress Notes (Signed)
[facility] [place]                       Date of Exam: 01/30/2020 11:29 AM        Patient ID: Jane Mccormick is a 23 y.o. female.  Attending Physician: Christen Bame, MD        Chief Complaint:    Chief Complaint   Patient presents with   . Annual Exam     had covid and booster done, glucose number high 135, need blood work, gyn exaam scheduled  10/21, need flu shot,    . Arthritis               HPI:    Patient presents today for wellness exam:     Was advised by her rheumatologist to be further evaluated after finding hyperglycemia on non-fasting labs. Today in clinic she was found to have prediabetes with A1C 6.2%    Cardiac Screening:   - Family h/o heart disease, CVA or DM: mom might have T2DM but patient is unsure  - Exercise: sprained her ankle recently so decreased   - Diet: overall healthy  - Tobacco:      Tobacco Use: Low Risk    . Smoking Tobacco Use: Never Smoker   . Smokeless Tobacco Use: Never Used     - Alcohol: Socially   - ROS: Denies chest pain, SOB, leg swelling, palpitations    Cancer Screening:   - Denies family history of colon cancer, cervical cancer, lung cancer, breast cancer, prostate cancer  - Cervical cancer:  Never had pap, due. Has gyn appointment coming up on 02/20/20 for first pap. Discussed how she may need an alternative pap schedule due to her immunosuppressive medications.  No results found for: PAP, PAP, PAP  - ROS: Denies unintentional weight loss, night sweats, fevers    Infectious Disease Screening:   - Tdap: UTD  - Hepatitis C screening: (18-79 yo): No results found for: HCVAB  - HIV screening: (15-65 yo) No results found for: HIV, HIV, HIV    Gyn history:   - LMP: 01/26/20  - G/P: 0  - Currently taking 4 mg of Folic acid or prenatal vitamin: Yes  - Regular periods: yes  - Contraceptive: No    Depression screening:   - Over the past 2 weeks, have you felt down, depressed or hopeless?- no  - Over the past 2 weeks, have you felt little interest or  pleasure in doing things?- no    Issues discussed outside of wellness:     JIA - follows closely with rheumatologist. Due for ophthalmology at end of this year. Last flare was in 2018. Trying to lose weight to help prevent joint pain in the future.    BP elevated today 129/84, had coffee this morning    Prediabetes, new diagnosis today, A1C 6.2%. Admits to eating high amounts of sugar. PreDM diagnosis upsets her so she is motivated to improve diet and average blood sugar.            Problem List:    Patient Active Problem List   Diagnosis   . JIA (juvenile idiopathic arthritis)   . Adolescent idiopathic scoliosis of thoracic region             Current Meds:    Outpatient Medications Marked as Taking for the 01/30/20 encounter (Office Visit) with Christen Bame, MD   Medication Sig Dispense Refill   .  Methotrexate, PF, 25 MG/0.5ML Solution Auto-injector Inject 25 mg into the skin once a week 4 pen 11   . naproxen (NAPROSYN) 500 MG tablet Take 500 mg by mouth 2 (two) times daily with meals     . Needles & Syringes Misc 25 mg by Does not apply route once a week Use for weekly MTX injections 12 each 3   . Tocilizumab 162 MG/0.9ML Solution Prefilled Syringe Inject 162 mg into the skin once a week 4 Syringe 11          Allergies:    No Known Allergies          Past Surgical History:    Past Surgical History:   Procedure Laterality Date   . TONSILLECTOMY  2009   . WISDOM TOOTH EXTRACTION             Family History:    Family History   Problem Relation Age of Onset   . No known problems Mother    . No known problems Father            Social History:    Social History     Tobacco Use   . Smoking status: Never Smoker   . Smokeless tobacco: Never Used   Vaping Use   . Vaping Use: Never used   Substance Use Topics   . Alcohol use: Yes     Comment: occ   . Drug use: Never          The following sections were reviewed this encounter by the provider:            Vital Signs:    BP 129/84 (BP Site: Left arm, Patient Position:  Sitting, Cuff Size: Large)   Pulse 67   Temp 98 F (36.7 C) (Oral)   Ht 1.829 m (6')   Wt 90.7 kg (200 lb)   LMP 01/26/2020   SpO2 99%   BMI 27.12 kg/m      Wt Readings from Last 3 Encounters:   01/30/20 90.7 kg (200 lb)   10/04/19 89.3 kg (196 lb 12.8 oz)   07/05/19 84.4 kg (186 lb)     Temp Readings from Last 3 Encounters:   01/30/20 98 F (36.7 C) (Oral)   07/05/19 98.7 F (37.1 C)   04/05/19 98.1 F (36.7 C) (Temporal)     BP Readings from Last 3 Encounters:   01/30/20 129/84   10/04/19 128/82   07/05/19 119/75     Pulse Readings from Last 3 Encounters:   01/30/20 67   10/04/19 74   07/05/19 75            ROS:    As per HPI          Physical Exam:    PEx:   General: well-nourished, well-developed, in NAD  Chest: RRR, S1/S2, II/VI systolic murmur  Lungs: CTAB, no wheezing, rhonchi or rales, good aeration bilaterally  Abdomen: bowel sounds present, soft, non-distended, non-tender  HENT: clear conjunctiva, EOMI, TMs pearly with light reflex present, no evidence of bulging or retractions, oropharynx non-erythematous, without exudates, no cervical LAD, no thyroid nodules palpable on exam  Extremities: No LE edema present  Neuro: awake, alert and oriented x3, CN grossly intact, sensation equal to BL UE and LE, strength 5/5 BL UE and LE, 2+ patellar and brachioradialis reflexes present          Assessment:    1. General medical exam    2. Need for  vaccination  - Flu vaccine QUADRIVALENT (PF) 6 months and older (FLULAVAL/FLUARIX)    3. Abnormal glucose  - POCT Hemoglobin A1C    4. Venereal disease screening  - HIV-1/2 Ag/Ab 4th Gen. w/ Reflex; Future  - Hepatitis C (HCV) antibody, Total; Future    5. Prediabetes  - Basic Metabolic Panel; Future  - Lipid panel; Future  - TSH, Abn Reflex to Free T4, Serum; Future    6. Systolic murmur  Pt with normal exercise tolerance, had echo about 5 years ago, will bring records. Consider echo if unable to obtain results or change in functional status.          Plan:       Patient Instructions     Please get me a copy of your last echocardiogram.    Today you were found to have prediabetes.  Please work on reducing the amount of sugar that you eat.  In this packet you will find information on the Mediterranean diet, which will help you achieve better overall health.  Try to make half of your plate vegetables, limit sugary drinks, and limit the amount of sugary snacks you have in your house.    Your hemoglobin A1c, or 21-month average of blood sugar, is 6.2%. Prediabetes is defined as an A1c of 5.7% to 6.4% diabetes is defined as an A1c of 6.5% or higher.  We usually start treating diabetes with medications when the A1c is 7.0% or higher.    You received your flu shot today.    Please follow-up as scheduled with gynecology for your Pap smears for cervical cancer screening.  Based on the medication that you are on for JIA, you may require more frequent Pap smears than the average woman.    Continue taking a daily vitamin that has at least 4 mg of folic acid.    Your blood pressure was slightly elevated today.  We will recheck this when you return in 3 months for your follow-up visit.  In the meantime, please check your blood pressure periodically and keep a log on your phone that we can review at your follow-up visit.        Understanding the Mediterranean Diet  A Mediterranean-style diet is a healthy way of eating, not a specific diet to lose weight. It includes a lot of foods from plants such as vegetables, fruits, and whole grains, plus olive oil and seafood. It also includes dairy foods and meats, but in smaller amounts. And it includes a moderate amount of wine. Many studies over time have shown health benefits to eating this way. It focuses on making fresh food that's full of flavor.  This plan of eating is inspired by how people eat in countries around the [employer]. They include [place], [place], [place], [place], [place], and [place]. But it uses foods you can buy in almost  any grocery store.  Health benefits of a Mediterranean diet  This diet is high in fiber, lean protein, and healthy oils. It's low in saturated fats and sugar. The diet has been shown to help prevent or manage:   Depression   Diabetes   Heart disease   High blood pressure   Parkinson disease   Alzheimer disease   Cancers of the colon, prostate, and breast  What do I eat on a Mediterranean diet?  Plan each meal around vegetables and whole grains. Use olive oil. Add nuts and legumes. Include fish or lean protein. Foods to focus your meals around include:  Food type What to eat   Vegetables This includes leafy greens, tomatoes, squash, peppers, cucumbers, green beans, eggplant, avocados, potatoes, and olives. You can use fresh or frozen vegetables.   Fruits This includes apples, raspberries, strawberries, grapes, citrus fruit such as oranges and grapefruit, stone fruit such as apricots and peaches, plus figs, dates, and melon.   Whole grains This includes brown rice, whole oats, quinoa, millet, whole grain bread, whole-wheat pasta, and crackers made with whole grains.   Beans and legumes These include lentils, chickpeas, and beans such as pinto, fava, kidney, and black beans. Peanuts are also legumes.   Seafood This includes fish such as salmon, trout, mackerel, haddock, tuna, sardines, anchovies, and whitefish. It also includes shellfish such as shrimp, oysters, mussels, and clams.   Nuts and seeds These include walnuts, almonds, sunflower seeds, cashews, [place] nuts, and pecans.   Healthy oil Olive oil is the most common oil in the Mediterranean diet. But other healthy oils are canola, sunflower, safflower, and corn oils.   Herbs and spices Season food with oregano, pepper, sage, tarragon, thyme, basil, cinnamon, and cumin.   Wine Enjoy a glass of wine each day with a meal. Skip this if you need to not have alcohol for any reason.   Foods to eat in smaller amounts  You can add these foods in a few days a week,  in smaller amounts:   Dairy foods, such as cheese, yogurt, and butter   Poultry, such as chicken and duck   Eggs  Occasional treats  Limit these foods in your weekly eating plan:   Red meats, such as beef, lamb, and pork   Refined grains, such as white rice and foods made with white flour   Sugary treats, such as chocolate, candy, or pastries  Adding lots of flavor  You can liven up fresh foods with many kinds of flavor. Try these sauces, dips, and seasonings:   Hummus   Marinara sauce   Salsa   Vinaigrette dressing  Tips for eating out  At restaurants:   Skip fried foods. These have a lot of saturated fat.   Look for fish dishes that are cooked without cream or butter.   Pick salads that have nuts and seeds.   Choose vegetarian options that don't have too much cheese.  StayWell last reviewed this educational content on 08/31/2018   2000-2021 [employer], [place]. All rights reserved. This information is not intended as a substitute for professional medical care. Always follow your healthcare professional's instructions.                    Follow-up:    Return in about 3 months (around 04/30/2020) for prediabetes and blood pressure check.         Christen Bame, MD

## 2020-01-31 ENCOUNTER — Other Ambulatory Visit (INDEPENDENT_AMBULATORY_CARE_PROVIDER_SITE_OTHER): Payer: Self-pay | Admitting: Residents

## 2020-02-01 LAB — BASIC METABOLIC PANEL
African American eGFR: 94 mL/min/{1.73_m2} (ref >59)
BUN / Creatinine Ratio: 15 (ref 9–23)
BUN: 15 mg/dL (ref 6–20)
CO2: 22 mmol/L (ref 20–29)
Calcium: 9.4 mg/dL (ref 8.7–10.2)
Chloride: 100 mmol/L (ref 96–106)
Creatinine: 0.98 mg/dL (ref 0.57–1.00)
Glucose: 110 mg/dL — ABNORMAL HIGH (ref 65–99)
Potassium: 3.9 mmol/L (ref 3.5–5.2)
Sodium: 138 mmol/L (ref 134–144)
non-African American eGFR: 82 mL/min/{1.73_m2} (ref >59)

## 2020-02-01 LAB — LIPID PANEL
Cholesterol / HDL Ratio: 2.7 ratio (ref 0.0–4.4)
Cholesterol: 189 mg/dL (ref 100–199)
HDL: 69 mg/dL (ref >39)
LDL Chol Calculated (NIH): 110 mg/dL — ABNORMAL HIGH (ref 0–99)
Triglycerides: 51 mg/dL (ref 0–149)
VLDL Calculated: 10 mg/dL (ref 5–40)

## 2020-02-01 LAB — THYROID STIMULATING HORMONE (TSH), REFLEX ON ABNORMAL TO FREE T4, SERUM: TSH: 1.49 u[IU]/mL (ref 0.450–4.500)

## 2020-02-01 LAB — HEPATITIS C ANTIBODY: HCV AB: 0.1 s/co ratio (ref 0.0–0.9)

## 2020-02-01 LAB — HIV-1/2 AG/AB 4TH GEN. W/ REFLEX: HIV Screen 4th Generation wRfx: NONREACTIVE

## 2020-02-11 ENCOUNTER — Ambulatory Visit (HOSPITAL_BASED_OUTPATIENT_CLINIC_OR_DEPARTMENT_OTHER): Payer: Self-pay | Admitting: Obstetrics & Gynecology

## 2020-02-14 ENCOUNTER — Telehealth (INDEPENDENT_AMBULATORY_CARE_PROVIDER_SITE_OTHER): Payer: Self-pay | Admitting: Residents

## 2020-02-14 NOTE — Telephone Encounter (Signed)
PT. Jane Mccormick to urgent care Monday, was given amoxcillin 875/125 mg tabs, 14 days still congested, clear mucos, no fever, right ear clogged, please advise. TH

## 2020-02-20 ENCOUNTER — Ambulatory Visit (HOSPITAL_BASED_OUTPATIENT_CLINIC_OR_DEPARTMENT_OTHER): Payer: BLUE CROSS/BLUE SHIELD | Admitting: Nurse Practitioner

## 2020-03-12 ENCOUNTER — Encounter (HOSPITAL_BASED_OUTPATIENT_CLINIC_OR_DEPARTMENT_OTHER): Payer: Self-pay | Admitting: Nurse Practitioner

## 2020-03-12 ENCOUNTER — Ambulatory Visit: Payer: Self-pay

## 2020-03-12 ENCOUNTER — Ambulatory Visit (INDEPENDENT_AMBULATORY_CARE_PROVIDER_SITE_OTHER): Payer: BLUE CROSS/BLUE SHIELD | Admitting: Nurse Practitioner

## 2020-03-12 VITALS — BP 148/70 | HR 80 | Temp 98.3°F | Ht 72.0 in | Wt 198.0 lb

## 2020-03-12 DIAGNOSIS — Z01419 Encounter for gynecological examination (general) (routine) without abnormal findings: Secondary | ICD-10-CM

## 2020-03-12 DIAGNOSIS — Z113 Encounter for screening for infections with a predominantly sexual mode of transmission: Secondary | ICD-10-CM

## 2020-03-12 DIAGNOSIS — Z30014 Encounter for initial prescription of intrauterine contraceptive device: Secondary | ICD-10-CM

## 2020-03-12 LAB — HEPATITIS B SURFACE ANTIGEN W/ REFLEX TO CONFIRMATION: Hepatitis B Surface Antigen: NONREACTIVE

## 2020-03-12 LAB — SYPHILIS SCREEN IGG AND IGM: Syphilis Screen IgG and IgM: NONREACTIVE

## 2020-03-12 LAB — THIN PREP PAP W/IMAGE ANALYSIS ,REFLEX HPV

## 2020-03-12 NOTE — Progress Notes (Signed)
CC/HPI    Jane Mccormick is a 23 y.o. female,  New patient, here today for annual gyn exam.    Had HIV and HCV testing thru PCP recently and would like remaining STD panel today.    Wants to discuss birth control.  Interested in IUDs.  In stable, monogamous relationship.  Nullipara.    GYN Hx   Sexually Active: yes  Birth Control: condoms  Menses: 11/6/monthly  Last pap: never  Hx abnormal paps: N/A  Colposcopy:  N/A  Hx STDs:  denies    OB Hx  [id number]    Allergies  No Known Allergies    Medications  Current Outpatient Medications   Medication Sig Dispense Refill   . Methotrexate Sodium (methotrexate, PF,) 1 g injection Infuse into the vein     . naproxen (NAPROSYN) 500 MG tablet Take 500 mg by mouth 2 (two) times daily with meals     . Needles & Syringes Misc 25 mg by Does not apply route once a week Use for weekly MTX injections 12 each 3   . Tocilizumab 162 MG/0.9ML Solution Prefilled Syringe Inject 162 mg into the skin once a week 4 Syringe 11     No current facility-administered medications for this visit.       PMH  Past Medical History:   Diagnosis Date   . JIA (juvenile idiopathic arthritis)    . Other accident 2016    arthrocentesis of the jaw            PSH  Past Surgical History:   Procedure Laterality Date   . ANKLE ARTHROCENTESIS     . KNEE ARTHROCENTESIS     . TONSILLECTOMY  2009   . WISDOM TOOTH EXTRACTION         SH  Social History     Socioeconomic History   . Marital status: Single     Spouse name: Not on file   . Number of children: Not on file   . Years of education: Not on file   . Highest education level: Not on file   Occupational History   . Not on file   Tobacco Use   . Smoking status: Never Smoker   . Smokeless tobacco: Never Used   Vaping Use   . Vaping Use: Never used   Substance and Sexual Activity   . Alcohol use: Not Currently     Comment: social   . Drug use: Never   . Sexual activity: Yes     Partners: Male     Birth control/protection: Condom   Other Topics Concern   . Not on file    Social History Narrative   . Not on file     Social Determinants of Health     Financial Resource Strain:    . Difficulty of Paying Living Expenses: Not on file   Food Insecurity:    . Worried About Programme researcher, broadcasting/film/video in the Last Year: Not on file   . Ran Out of Food in the Last Year: Not on file   Transportation Needs:    . Lack of Transportation (Medical): Not on file   . Lack of Transportation (Non-Medical): Not on file   Physical Activity:    . Days of Exercise per Week: Not on file   . Minutes of Exercise per Session: Not on file   Stress:    . Feeling of Stress : Not on file   Social Connections:    .  Frequency of Communication with Friends and Family: Not on file   . Frequency of Social Gatherings with Friends and Family: Not on file   . Attends Religious Services: Not on file   . Active Member of Clubs or Organizations: Not on file   . Attends Banker Meetings: Not on file   . Marital Status: Not on file   Intimate Partner Violence:    . Fear of Current or Ex-Partner: Not on file   . Emotionally Abused: Not on file   . Physically Abused: Not on file   . Sexually Abused: Not on file   Housing Stability:    . Unable to Pay for Housing in the Last Year: Not on file   . Number of Places Lived in the Last Year: Not on file   . Unstable Housing in the Last Year: Not on file       FH  Family History   Problem Relation Age of Onset   . No known problems Mother    . No known problems Father    . No known problems Brother    . Breast cancer Paternal Grandmother    . Cervical cancer Maternal Grandmother    . Cancer Neg Hx    . Colon cancer Neg Hx    . Diabetes Neg Hx    . Eclampsia Neg Hx    . Hypertension Neg Hx    . Miscarriages / Stillbirths Neg Hx    . Ovarian cancer Neg Hx    . Preterm labor Neg Hx    . Stroke Neg Hx        ROS  General: negative for fatigue, fever/chills, weight loss  Breast: negative for breast lumps, nipple discharge  Genitourinary: no dysuria, trouble voiding, or  hematuria  Gynecological:  no discharge, vaginal odor, menorrhagia, pelvic pain  Psych: denies depression    VITAL SIGNS  BP 148/70   Pulse 80   Temp 98.3 F (36.8 C)   Ht 6' (1.829 m)   Wt 198 lb (89.8 kg)   LMP 02/19/2020 (Within Days)   BMI 26.85 kg/m     EXAM  GENERAL APPEARANCE    Alert, Well appearing, Oriented x 3, In no distress    BREAST  Sexual Maturity:  Tanner 5  Nipples:  Left - normal, no discharge; Right - normal, no discharge  Breast:  symmetrical; Left - normal, non-tender, no mass; Right - normal, non-tender, no mass    PELVIC  Vulva:  normal, no lesions, normal clitorus  Perineum:  intact  Introitus:  normal, no lesions, no discharge  Urethra:  normal  Vagina:  normal, no lesions, no masses, no tenderness  Cervix:  normal, no discharge, no lesions, non-friable  Uterus:  normal, not enlarged, no masses  Adnexa:  Left - normal, non-tender, not enlarged; Right - normal, non-tender, not enlarged  Rectal:  deferred    ASSESSMENT  1. Encounter for gynecological examination (general) (routine) without abnormal findings    2. Encounter for initial prescription of intrauterine contraceptive device (IUD)    3. Screen for STD (sexually transmitted disease)        PLAN  Orders Placed This Encounter   Procedures   . Genital Chlamydia/Neisseria BY PCR   . Vaginal - Trichomonas PCR - [place]   . Hepatitis B (HBV) Surface Antigen w/ Reflex to Confirmation   . Syphilis Screen IgG and IgM     1. Pt aware normal results will be sent  via My Chart.  2. Pap today.  3. Reviewed what remaining STD panel includes.  4. Reviewed all birth control options and IUDs in detail.  Leaning towards [place].  Will RTO for insertion while on next menses.  Premedicate with Ibuprofen 800 mg.  Cultures obtained today.  Pt aware insertion may be difficult due to nulliparous cervix.    Vivan Vanderveer L. Debria Broecker, MS, RN, WHNP-BC

## 2020-03-13 LAB — GENITAL CHLAMYDIA/NEISSERIA BY PCR
Chlamydia DNA by PCR: NEGATIVE
Neisseria gonorrhoeae by PCR: NEGATIVE

## 2020-03-13 LAB — VAGINAL - TRICHOMONAS PCR - ~~LOC~~: Trichomonas vaginalis, DNA: NOT DETECTED

## 2020-03-15 LAB — LAB USE ONLY - HISTORICAL GYN CYTOLOGY/PAP SMEAR

## 2020-03-19 ENCOUNTER — Encounter (HOSPITAL_BASED_OUTPATIENT_CLINIC_OR_DEPARTMENT_OTHER): Payer: Self-pay

## 2020-03-20 ENCOUNTER — Encounter (HOSPITAL_BASED_OUTPATIENT_CLINIC_OR_DEPARTMENT_OTHER): Payer: Self-pay | Admitting: Nurse Practitioner

## 2020-03-20 ENCOUNTER — Ambulatory Visit (INDEPENDENT_AMBULATORY_CARE_PROVIDER_SITE_OTHER): Payer: BLUE CROSS/BLUE SHIELD | Admitting: Nurse Practitioner

## 2020-03-20 VITALS — BP 125/73 | HR 70 | Ht 72.0 in | Wt 199.0 lb

## 2020-03-20 DIAGNOSIS — Z30430 Encounter for insertion of intrauterine contraceptive device: Secondary | ICD-10-CM

## 2020-03-20 LAB — POCT PREGNANCY TEST, URINE HCG: POCT Pregnancy HCG Test, UR: NEGATIVE

## 2020-03-20 MED ORDER — LEVONORGESTREL 19.5 MG IU IUD
1.0000 | INTRAUTERINE_SYSTEM | Freq: Once | INTRAUTERINE | Status: AC
Start: 2020-03-20 — End: 2020-03-20
  Administered 2020-03-20: 10:00:00 19.5 mg via INTRAUTERINE

## 2020-03-20 NOTE — Progress Notes (Signed)
UNIVERSAL PROTOCOL:  TIMEOUT    Procedure:Kyleena Insertion Date: 03/20/2020   All correct equipment/supplies are present  And ready for use prior to the procedure Yes   Patient stated name and date of birth Yes   Patient verbally stated the procedure (including the site and side) to be completed. Yes   Informed Consent reviewed and signed consistent with procedure, side, and site information Yes   Practitioner (MD/DO/DPM/NP/PA/RN) performing the procedure marked the site as indicated N/A   Asked the patient for any known drug allergies, including anesthetics and latex. Yes   Labels for specimens are prepared with the following information:  Date specimen collected  Name of patient  MR#  Provider name  Specimen type  and placed on containers in presence of patient N/A   Verified that patient has had all questions answered. Yes   Method of notification of biopsy results requested by patient N/A

## 2020-03-20 NOTE — Procedures (Signed)
Kyleena Insertion    Uterus anteverted on exam.  Cervix cleansed with Betadine and tenaculum placed on posterior lip.  Uterus sounded to 9 cms.  Kyleena inserted without difficulty.  Strings trimmed to 2.5 cms.  Adequate hemostasis at tenaculum sites maintained with application of pressure.

## 2020-03-20 NOTE — Progress Notes (Signed)
Here for [facility] insertion.  Had consult on 11/11.  In stable, monogamous relationship.  Nullipara.  LMP 11/16.  Premedicated with 2 Aleve.  Cultures negative.    1.  See procedure notes.  F/u 1 month visit.

## 2020-04-13 ENCOUNTER — Telehealth (INDEPENDENT_AMBULATORY_CARE_PROVIDER_SITE_OTHER): Payer: BLUE CROSS/BLUE SHIELD | Admitting: Rheumatology

## 2020-04-13 ENCOUNTER — Encounter (INDEPENDENT_AMBULATORY_CARE_PROVIDER_SITE_OTHER): Payer: Self-pay | Admitting: Rheumatology

## 2020-04-13 DIAGNOSIS — M0880 Other juvenile arthritis, unspecified site: Secondary | ICD-10-CM

## 2020-04-13 DIAGNOSIS — M41124 Adolescent idiopathic scoliosis, thoracic region: Secondary | ICD-10-CM

## 2020-04-13 DIAGNOSIS — Z79899 Other long term (current) drug therapy: Secondary | ICD-10-CM

## 2020-04-13 MED ORDER — TOCILIZUMAB 162 MG/0.9ML SC SOSY
162.0000 mg | PREFILLED_SYRINGE | SUBCUTANEOUS | 3 refills | Status: DC
Start: 2020-04-13 — End: 2021-05-14

## 2020-04-13 NOTE — Progress Notes (Signed)
[facility] Telehealth Follow-up    PCP: Christen Bame, MD    Verbal consent has been obtained from the patient to conduct a video and telephone visit to minimize exposure to COVID-19: yes    Chief complaint: No chief complaint on file.      Carroll Dredge is a 24 y.o. female who presents to the rheumatology clinic for follow-up.     HPI:   New job  Primary care doc - re glucose. HgbA1c slightly elevated.  Lost 10#. Reduced sugar.    No flares of arthritis.Maintained on tocilizumab and methotrexate.    The following sections were reviewed this encounter by the provider:   Tobacco  Allergies  Meds  Problems  Med Hx  Surg Hx  Fam Hx         PMH/PSH:  Past Medical History:   Diagnosis Date   . JIA (juvenile idiopathic arthritis)    . Other accident 2016    arthrocentesis of the jaw         Meds/ Allergies:  No outpatient medications have been marked as taking for the 04/13/20 encounter (Telemedicine Visit) with Lawrence Marseilles, MD.     No Known Allergies    REVIEW OF SYSTEMS   Review of Systems   Constitutional: Negative for chills and fever.   Gastrointestinal: Negative for nausea and vomiting.   Musculoskeletal: Negative for joint pain.        PHYSICAL EXAM  There were no vitals filed for this visit.     General appearance:   WNWD, NAD, alert and oriented    Data reviewed this encounter:  Labs:  Lab Results   Component Value Date    WBC 5.1 01/27/2020    HGB 14.1 01/27/2020    HCT 41.8 01/27/2020    MCV 92.7 01/27/2020    PLT 197 01/27/2020      Lab Results   Component Value Date    CREAT 0.98 01/31/2020    BUN 15 01/31/2020    NA 138 01/31/2020    K 3.9 01/31/2020    CL 100 01/31/2020    CO2 22 01/31/2020      Lab Results   Component Value Date    ALT 14 01/27/2020    AST 21 01/27/2020    ALKPHOS 37 01/27/2020    BILITOTAL 0.9 01/27/2020      No results found for: ESR   Lab Results   Component Value Date    CRP 0.4 01/27/2020      No components found for: URIC ACID    ASSESSMENT / PLAN    1. JIA  (juvenile idiopathic arthritis)  - Tocilizumab 162 MG/0.9ML Solution Prefilled Syringe; Inject 162 mg into the skin once a week  Dispense: 10.8 mL; Refill: 3    2. Adolescent idiopathic scoliosis of thoracic region    3. High risk medication use    JIA - renew Tocilizumab (written for 3 month supply)  High risk med: methotrexate.   CBC, CMP every 2-3 months for drug monitoring. Calynn plans to have labs done later this week.    Return in about 4 months (around 08/12/2020).      PATIENT INSTRUCTIONS:  There are no Patient Instructions on file for this visit.       Marnee Guarneri, MD, FACP, [facility] Rheumatologist  [address]  [place], [place] [address]  P [phone]  F [phone]

## 2020-04-20 ENCOUNTER — Encounter (HOSPITAL_BASED_OUTPATIENT_CLINIC_OR_DEPARTMENT_OTHER): Payer: Self-pay | Admitting: Nurse Practitioner

## 2020-04-20 ENCOUNTER — Ambulatory Visit (HOSPITAL_BASED_OUTPATIENT_CLINIC_OR_DEPARTMENT_OTHER): Payer: BLUE CROSS/BLUE SHIELD | Admitting: Nurse Practitioner

## 2020-04-20 VITALS — BP 123/77 | HR 78 | Temp 97.9°F | Ht 72.0 in | Wt 192.0 lb

## 2020-04-20 DIAGNOSIS — Z975 Presence of (intrauterine) contraceptive device: Secondary | ICD-10-CM

## 2020-04-20 NOTE — Progress Notes (Signed)
CC/HPI    Jane Mccormick is a 24 y.o. female, established patient here today for 1 month f/u to [facility] insertion.    Doing well.  Reports she had pretty bad cramping for the first day and has been spotting ever since.  Had 1st menses and it was a little lighter than normal.    HISTORY    GYN Hx  Sexually Active: yes  Birth Control: condoms  Menses: 11/6/monthly  Last pap: 03/2020, negative (1st)  Hx abnormal paps: N/A  Colposcopy:  N/A  Hx STDs:  denies    OB Hx  [id number]    Allergies  No Known Allergies    Medications  Current Outpatient Medications   Medication Sig Dispense Refill   . levonorgestrel (KYLEENA) 19.5 MG intrauterine device 1 each by Intrauterine route once     . Methotrexate Sodium (methotrexate, PF,) 1 g injection Infuse into the vein     . naproxen (NAPROSYN) 500 MG tablet Take 500 mg by mouth 2 (two) times daily with meals     . Needles & Syringes Misc 25 mg by Does not apply route once a week Use for weekly MTX injections 12 each 3   . Tocilizumab 162 MG/0.9ML Solution Prefilled Syringe Inject 162 mg into the skin once a week 10.8 mL 3     No current facility-administered medications for this visit.       PMH  Past Medical History:   Diagnosis Date   . JIA (juvenile idiopathic arthritis)    . Other accident 2016    arthrocentesis of the jaw            PSH  Past Surgical History:   Procedure Laterality Date   . ANKLE ARTHROCENTESIS     . KNEE ARTHROCENTESIS     . TONSILLECTOMY  2009   . WISDOM TOOTH EXTRACTION         SH  Social History     Socioeconomic History   . Marital status: Single   Tobacco Use   . Smoking status: Never Smoker   . Smokeless tobacco: Never Used   Vaping Use   . Vaping Use: Never used   Substance and Sexual Activity   . Alcohol use: Yes     Comment: social   . Drug use: Never   . Sexual activity: Yes     Partners: Male     Birth control/protection: Condom       FH  Family History   Problem Relation Age of Onset   . No known problems Mother    . No known problems Father    .  No known problems Brother    . Breast cancer Paternal Grandmother    . Cervical cancer Maternal Grandmother    . Cancer Neg Hx    . Colon cancer Neg Hx    . Diabetes Neg Hx    . Eclampsia Neg Hx    . Hypertension Neg Hx    . Miscarriages / Stillbirths Neg Hx    . Ovarian cancer Neg Hx    . Preterm labor Neg Hx    . Stroke Neg Hx      VITAL SIGNS  BP 123/77   Pulse 78   Temp 97.9 F (36.6 C)   Ht 6' (1.829 m)   Wt 192 lb (87.1 kg)   LMP 04/12/2020 (Exact Date)   BMI 26.04 kg/m     EXAM    General Appearance  Alert, Well appearing, Oriented  x 3, In no distress    Pelvic  Vulva:  normal, no lesions, normal clitorus  Perineum:  intact  Introitus:  normal, no lesions, no discharge  Urethra:  normal  Vagina:  normal, no lesions, no discharge, no masses, no tenderness  Cervix:  normal, no discharge, no lesions, non-friable, IUD strings noted  Uterus:  normal, not enlarged, no masses  Adnexa:  Left - normal, non-tender, not enlarged; Right - normal, non-tender, not enlarged  Rectal:  deferred      ASSESSMENT  1. IUD (intrauterine device) in place          PLAN    1. Normal exam, pt reassured.  Reviewed bleeding profile.  2. F/u prn or in 03/2021 for annual exam.        Saim Almanza L. Tikita Mabee, MS, RN, WHNP-BC

## 2020-04-30 ENCOUNTER — Ambulatory Visit (INDEPENDENT_AMBULATORY_CARE_PROVIDER_SITE_OTHER): Payer: BLUE CROSS/BLUE SHIELD | Admitting: Residents

## 2020-05-14 ENCOUNTER — Ambulatory Visit (INDEPENDENT_AMBULATORY_CARE_PROVIDER_SITE_OTHER): Payer: BLUE CROSS/BLUE SHIELD | Admitting: Residents

## 2020-05-14 ENCOUNTER — Encounter (INDEPENDENT_AMBULATORY_CARE_PROVIDER_SITE_OTHER): Payer: Self-pay | Admitting: Residents

## 2020-05-14 VITALS — BP 123/82 | HR 90 | Temp 98.4°F | Wt 192.0 lb

## 2020-05-14 DIAGNOSIS — R7303 Prediabetes: Secondary | ICD-10-CM

## 2020-05-14 DIAGNOSIS — M0880 Other juvenile arthritis, unspecified site: Secondary | ICD-10-CM

## 2020-05-14 LAB — POCT HEMOGLOBIN A1C: POCT Hgb A1C: 6 % — AB (ref 3.9–5.9)

## 2020-05-14 NOTE — Patient Instructions (Signed)
Get your A1C every 3 months. If it's increasing we will make an appointment to discuss. Otherwise follow up in a year.

## 2020-05-14 NOTE — Progress Notes (Signed)
[name] [place]                       Date of Exam: 05/14/2020 10:49 AM        Patient ID: Jane Mccormick is a 25 y.o. female.  Attending Physician: Christen Bame, MD        Chief Complaint:    Chief Complaint   Patient presents with   . Follow-up     blood pressure and diet               HPI:    HPI    24y F with JIA and PreDM, here for f/u    #PreDM, A1C 6.2% about 3 months ago, today is 6.0  Lost 10lb per home scale  Trying to eat healthier  LDL 110  Normal TSH  BP today wnl  Contacted her peds rheumatologist, was told she has had hyperglycemia for many years    #JIA - saw rheum, plan for CBC and CMP every 2-3 months while on Tocilizumab, due for labs soon.    #HM - Pap up to date. Received 3 COVID doses, plans for 4th in about 3 months as she is on immunosuppression          Problem List:    Patient Active Problem List   Diagnosis   . JIA (juvenile idiopathic arthritis)   . Adolescent idiopathic scoliosis of thoracic region   . Prediabetes             Current Meds:    Outpatient Medications Marked as Taking for the 05/14/20 encounter (Office Visit) with Christen Bame, MD   Medication Sig Dispense Refill   . levonorgestrel (KYLEENA) 19.5 MG intrauterine device 1 each by Intrauterine route once     . Methotrexate Sodium (methotrexate, PF,) 1 g injection Infuse into the vein     . naproxen (NAPROSYN) 500 MG tablet Take 500 mg by mouth 2 (two) times daily with meals     . Needles & Syringes Misc 25 mg by Does not apply route once a week Use for weekly MTX injections 12 each 3   . Tocilizumab 162 MG/0.9ML Solution Prefilled Syringe Inject 162 mg into the skin once a week 10.8 mL 3          Allergies:    No Known Allergies          Past Surgical History:    Past Surgical History:   Procedure Laterality Date   . ANKLE ARTHROCENTESIS     . KNEE ARTHROCENTESIS     . TONSILLECTOMY  2009   . WISDOM TOOTH EXTRACTION             Family History:    Family History   Problem Relation Age of Onset   . No  known problems Mother    . No known problems Father    . No known problems Brother    . Breast cancer Paternal Grandmother    . Cervical cancer Maternal Grandmother    . Cancer Neg Hx    . Colon cancer Neg Hx    . Diabetes Neg Hx    . Eclampsia Neg Hx    . Hypertension Neg Hx    . Miscarriages / Stillbirths Neg Hx    . Ovarian cancer Neg Hx    . Preterm labor Neg Hx    . Stroke Neg Hx  Social History:    Social History     Tobacco Use   . Smoking status: Never Smoker   . Smokeless tobacco: Never Used   Vaping Use   . Vaping Use: Never used   Substance Use Topics   . Alcohol use: Yes     Comment: social   . Drug use: Never          The following sections were reviewed this encounter by the provider:   Tobacco  Allergies  Meds  Problems  Med Hx  Surg Hx  Fam Hx             Vital Signs:    BP 123/82 (BP Site: Left arm, Patient Position: Sitting, Cuff Size: Large)   Pulse 90   Temp 98.4 F (36.9 C) (Temporal)   Wt 87.1 kg (192 lb)   SpO2 99%   BMI 26.04 kg/m      Wt Readings from Last 3 Encounters:   05/14/20 87.1 kg (192 lb)   04/20/20 87.1 kg (192 lb)   03/20/20 90.3 kg (199 lb)     Temp Readings from Last 3 Encounters:   05/14/20 98.4 F (36.9 C) (Temporal)   04/20/20 97.9 F (36.6 C)   03/12/20 98.3 F (36.8 C)     BP Readings from Last 3 Encounters:   05/14/20 123/82   04/20/20 123/77   03/20/20 125/73     Pulse Readings from Last 3 Encounters:   05/14/20 90   04/20/20 78   03/20/20 70            ROS:    Review of Systems   as per HPI         Physical Exam:    Physical Exam   Constitutional:       General: Not in acute distress.     Appearance: Normal appearance. Not ill-appearing.   HENT:      Head: Normocephalic and atraumatic.   Eyes:      Extraocular Movements: Extraocular movements intact.      Conjunctiva/sclera: Conjunctivae normal.   Pulmonary:      Effort: Pulmonary effort is normal.   Musculoskeletal:         General: Normal range of motion.      Cervical back: Normal range of  motion.   Skin:     Findings: No rash.   Neurological:      Mental Status: Alert and oriented to person, place, and time.   Psychiatric:         Mood and Affect: Mood normal.         Behavior: Behavior normal.         Thought Content: Thought content normal.         Judgment: Judgment normal.           Assessment:    1. Prediabetes  - Hemoglobin A1C; Standing  - POCT Hemoglobin A1C    2. JIA (juvenile idiopathic arthritis)  - POCT Hemoglobin A1C    may have component of T1DM given autoimmune history as she does not have other major risk factors for hyperglycemia        Plan:      trend A1C 4 times per year, pt at increased risk of developing diabetes    Patient Instructions   Get your A1C every 3 months. If it's increasing we will make an appointment to discuss. Otherwise follow up in a year.  Follow-up:    Return in about 1 year (around 05/14/2021) for Wellness.         Christen Bame, MD

## 2020-10-15 ENCOUNTER — Other Ambulatory Visit (INDEPENDENT_AMBULATORY_CARE_PROVIDER_SITE_OTHER): Payer: Self-pay | Admitting: Rheumatology

## 2020-10-19 ENCOUNTER — Other Ambulatory Visit (INDEPENDENT_AMBULATORY_CARE_PROVIDER_SITE_OTHER): Payer: Self-pay | Admitting: Rheumatology

## 2020-10-19 ENCOUNTER — Telehealth: Payer: Self-pay

## 2020-10-19 NOTE — Telephone Encounter (Signed)
Received request Via EPIC/MSOT in basket    Medication: ACTEMRA ACTPEN 162 MG/0.9 ML    PA on file til 11/27/2020    Update liaison

## 2020-10-20 NOTE — Telephone Encounter (Signed)
Actemra- Released order to [facility] per insurance requirements. Will f/u on PA when renewal comes due.

## 2020-10-27 ENCOUNTER — Encounter (INDEPENDENT_AMBULATORY_CARE_PROVIDER_SITE_OTHER): Payer: Self-pay | Admitting: Rheumatology

## 2020-10-27 DIAGNOSIS — U071 COVID-19: Secondary | ICD-10-CM

## 2020-10-27 MED ORDER — PAXLOVID 20 X 150 MG & 10 X 100MG PO TBPK
3.0000 | ORAL_TABLET | Freq: Two times a day (BID) | ORAL | 0 refills | Status: AC
Start: 2020-10-27 — End: 2020-11-01

## 2020-11-13 ENCOUNTER — Encounter (INDEPENDENT_AMBULATORY_CARE_PROVIDER_SITE_OTHER): Payer: Self-pay | Admitting: Rheumatology

## 2020-11-13 ENCOUNTER — Telehealth (INDEPENDENT_AMBULATORY_CARE_PROVIDER_SITE_OTHER): Payer: BLUE CROSS/BLUE SHIELD | Admitting: Rheumatology

## 2020-11-13 DIAGNOSIS — M0880 Other juvenile arthritis, unspecified site: Secondary | ICD-10-CM

## 2020-11-13 DIAGNOSIS — M25561 Pain in right knee: Secondary | ICD-10-CM

## 2020-11-13 DIAGNOSIS — Z79899 Other long term (current) drug therapy: Secondary | ICD-10-CM

## 2020-11-13 DIAGNOSIS — M083 Juvenile rheumatoid polyarthritis (seronegative): Secondary | ICD-10-CM

## 2020-11-13 DIAGNOSIS — M25461 Effusion, right knee: Secondary | ICD-10-CM

## 2020-11-13 NOTE — Progress Notes (Signed)
[facility] Telehealth Follow-up    PCP: Christen Bame, MD    Verbal consent has been obtained from the patient to conduct a video and telephone visit to minimize exposure to COVID-19: yes    Chief complaint:   Chief Complaint   Patient presents with    Arthritis       Jane Mccormick is a 25 y.o. female who presents to the rheumatology clinic for follow-up.     HPI:   Persistent JIA symptoms into adulthood. Controlled with Actemra and methotrexate.  She has an IUD in place for contraception re methotrexate.    She recently had covid and this was associated with return of swelling, discomfort in her right knee that began in Apirl 2022 while playing volleyball. She met with an orthopedist and followed a conservative approach. Her right knee has improved and no other joints have flared.    I updated her Rx June 2022. She is due to update labs.     The following sections were reviewed this encounter by the provider:   Tobacco  Allergies  Meds  Problems  Med Hx  Surg Hx  Fam Hx           PMH/PSH:  Past Medical History:   Diagnosis Date    JIA (juvenile idiopathic arthritis)     Other accident 2016    arthrocentesis of the jaw         Meds/ Allergies:  No outpatient medications have been marked as taking for the 11/13/20 encounter (Telemedicine Visit) with Lawrence Marseilles, MD.     No Known Allergies    REVIEW OF SYSTEMS   Review of Systems   Gastrointestinal:  Negative for nausea.   Skin:  Negative for rash.      PHYSICAL EXAM  There were no vitals filed for this visit.     General appearance:   WNWD, NAD, alert and oriented    Data reviewed this encounter:  Labs:  Lab Results   Component Value Date    WBC 5.1 01/27/2020    HGB 14.1 01/27/2020    HCT 41.8 01/27/2020    MCV 92.7 01/27/2020    PLT 197 01/27/2020      Lab Results   Component Value Date    CREAT 0.98 01/31/2020    BUN 15 01/31/2020    NA 138 01/31/2020    K 3.9 01/31/2020    CL 100 01/31/2020    CO2 22 01/31/2020      Lab Results   Component  Value Date    ALT 14 01/27/2020    AST 21 01/27/2020    ALKPHOS 37 01/27/2020    BILITOTAL 0.9 01/27/2020      No results found for: ESR   Lab Results   Component Value Date    CRP 0.4 01/27/2020      No components found for: URIC ACID    ASSESSMENT / PLAN    1. JIA (juvenile idiopathic arthritis)  - C Reactive Protein; Future    2. Pain and swelling of right knee  - C Reactive Protein; Future    3. High risk medication use  - CBC and differential; Future  - C Reactive Protein; Future  - Comprehensive metabolic panel; Future    JIA: cont methotrexate, actemra.   Update labs - sent via Mychart    Right knee pain/swelling - improving. Most c/w sports injury +/- mild flare triggered by Covid. No change in therapy needed at this  time.    High risk med: methotrexate.   CBC, CMP every 2-3 months for drug monitoring.        No follow-ups on file.      PATIENT INSTRUCTIONS:  There are no Patient Instructions on file for this visit.       Marnee Guarneri, MD, FACP, [facility] Rheumatologist  [address], [address]  [place], [place] [address]  P [phone]  F [phone]

## 2020-12-14 ENCOUNTER — Telehealth (INDEPENDENT_AMBULATORY_CARE_PROVIDER_SITE_OTHER): Payer: BLUE CROSS/BLUE SHIELD | Admitting: Rheumatology

## 2021-02-24 ENCOUNTER — Telehealth: Payer: Self-pay

## 2021-02-24 ENCOUNTER — Other Ambulatory Visit (INDEPENDENT_AMBULATORY_CARE_PROVIDER_SITE_OTHER): Payer: Self-pay | Admitting: Rheumatology

## 2021-02-24 NOTE — Telephone Encounter (Signed)
Received request Via EPIC/MSOT in basket.      Medication:   ACTEMRA 162 MG/0.9ML Solution Auto-injector    PRIOR AUTHORIZATION EXPIRES ON 05/31/22    Status:  [facility]    Updated liaison

## 2021-02-25 NOTE — Telephone Encounter (Signed)
Actemra- Released new MSOT order to [facility] per insurance requirements.

## 2021-03-24 LAB — CBC AND DIFFERENTIAL
Baso(Absolute): 0 10*3/uL (ref 0.0–0.2)
Basophils Automated: 1 %
Eosinophils Absolute: 0.1 10*3/uL (ref 0.0–0.4)
Eosinophils Automated: 2 %
Hematocrit: 42.4 % (ref 34.0–46.6)
Hemoglobin: 14.3 g/dL (ref 11.1–15.9)
Immature Granulocytes Absolute: 0 10*3/uL (ref 0.0–0.1)
Immature Granulocytes: 0 %
Lymphocytes Absolute: 1.2 10*3/uL (ref 0.7–3.1)
Lymphocytes Automated: 15 %
MCH: 31.7 pg (ref 26.6–33.0)
MCHC: 33.7 g/dL (ref 31.5–35.7)
MCV: 94 fL (ref 79–97)
Monocytes Absolute: 0.5 10*3/uL (ref 0.1–0.9)
Monocytes: 6 %
Neutrophils Absolute Count: 6.4 10*3/uL (ref 1.4–7.0)
Neutrophils: 76 %
Platelets: 218 10*3/uL (ref 150–450)
RBC: 4.51 x10E6/uL (ref 3.77–5.28)
RDW: 12.3 % (ref 11.7–15.4)
WBC: 8.2 10*3/uL (ref 3.4–10.8)

## 2021-03-24 LAB — COMPREHENSIVE METABOLIC PANEL
ALT: 13 IU/L (ref 0–32)
AST (SGOT): 18 IU/L (ref 0–40)
Albumin/Globulin Ratio: 3 — ABNORMAL HIGH (ref 1.2–2.2)
Albumin: 4.8 g/dL (ref 3.9–5.0)
Alkaline Phosphatase: 37 IU/L — ABNORMAL LOW (ref 44–121)
BUN / Creatinine Ratio: 13 (ref 9–23)
BUN: 11 mg/dL (ref 6–20)
Bilirubin, Total: 1 mg/dL (ref 0.0–1.2)
CO2: 24 mmol/L (ref 20–29)
Calcium: 9 mg/dL (ref 8.7–10.2)
Chloride: 103 mmol/L (ref 96–106)
Creatinine: 0.83 mg/dL (ref 0.57–1.00)
Globulin, Total: 1.6 g/dL (ref 1.5–4.5)
Glucose: 116 mg/dL — ABNORMAL HIGH (ref 70–99)
Potassium: 4.1 mmol/L (ref 3.5–5.2)
Protein, Total: 6.4 g/dL (ref 6.0–8.5)
Sodium: 142 mmol/L (ref 134–144)
eGFR: 101 mL/min/{1.73_m2} (ref >59)

## 2021-03-24 LAB — C-REACTIVE PROTEIN: C-Reactive Protein: <1 mg/L (ref 0–10)

## 2021-04-30 ENCOUNTER — Ambulatory Visit: Payer: Self-pay

## 2021-04-30 ENCOUNTER — Encounter (HOSPITAL_BASED_OUTPATIENT_CLINIC_OR_DEPARTMENT_OTHER): Payer: Self-pay | Admitting: Nurse Practitioner

## 2021-04-30 ENCOUNTER — Ambulatory Visit (INDEPENDENT_AMBULATORY_CARE_PROVIDER_SITE_OTHER): Payer: BLUE CROSS/BLUE SHIELD | Admitting: Nurse Practitioner

## 2021-04-30 ENCOUNTER — Ambulatory Visit (HOSPITAL_BASED_OUTPATIENT_CLINIC_OR_DEPARTMENT_OTHER): Payer: BLUE CROSS/BLUE SHIELD | Admitting: Nurse Practitioner

## 2021-04-30 VITALS — BP 113/71 | HR 97 | Temp 98.6°F | Ht 71.77 in | Wt 193.4 lb

## 2021-04-30 DIAGNOSIS — Z975 Presence of (intrauterine) contraceptive device: Secondary | ICD-10-CM

## 2021-04-30 DIAGNOSIS — Z01419 Encounter for gynecological examination (general) (routine) without abnormal findings: Secondary | ICD-10-CM

## 2021-04-30 DIAGNOSIS — Z113 Encounter for screening for infections with a predominantly sexual mode of transmission: Secondary | ICD-10-CM

## 2021-04-30 LAB — THIN PREP-PAP W/IMAGING, REFLEX HPV DNA(HIGH RISK W/16/18)

## 2021-04-30 LAB — HEPATITIS C ANTIBODY: Hepatitis C, AB: NONREACTIVE

## 2021-04-30 LAB — SYPHILIS SCREEN IGG AND IGM: Syphilis Screen IgG and IgM: NONREACTIVE

## 2021-04-30 LAB — HEPATITIS B SURFACE ANTIGEN W/ REFLEX TO CONFIRMATION: Hepatitis B Surface Antigen: NONREACTIVE

## 2021-04-30 LAB — HIV-1/2 AG/AB 4TH GEN. W/ REFLEX: HIV Ag/Ab, 4th Generation: NONREACTIVE

## 2021-04-30 NOTE — Progress Notes (Signed)
CC/HPI    Jane Mccormick is a 25 y.o. female, established patient, here today for annual gyn exam.    Rheumatologist and PCP recommended yearly paps given she is on immunosuppressants.      Reports last 4-5 menses with Jane Mccormick have been very light and hasn't had a menses this month.    GYN Hx  Sexually Active: not currently  Birth Control: Jane Mccormick, 03/2020  Menses: 11/6/monthly  Last pap: 03/2020, negative (1st)  Hx abnormal paps: N/A  Colposcopy:  N/A  Hx STDs:  denies    OB Hx  G0P0000    Allergies  No Known Allergies    Medications  Current Outpatient Medications   Medication Sig Dispense Refill    Actemra ACTPen 162 MG/0.9ML Solution Auto-injector INJECT 1 PEN UNDER THE SKIN EVERY 7 DAYS. 4 each 2    levonorgestrel (KYLEENA) 19.5 MG intrauterine device 1 each by Intrauterine route once      methotrexate sodium, PF, 50 MG/2ML Solution injection INJECT 25 MG (1 ML) UNDER THE SKIN ONCE A WEEK. SINGLE DOSE VIAL. DISCARD UNUSED PORTION AFTER INITIAL USE. 20 mL 0    naproxen (NAPROSYN) 500 MG tablet Take 500 mg by mouth 2 (two) times daily with meals      Needles & Syringes Misc 25 mg by Does not apply route once a week Use for weekly MTX injections 12 each 3    Methotrexate Sodium (methotrexate, PF,) 1 g injection Infuse into the vein      Tocilizumab 162 MG/0.9ML Solution Prefilled Syringe Inject 162 mg into the skin once a week 10.8 mL 3     No current facility-administered medications for this visit.       PMH  Past Medical History:   Diagnosis Date    JIA (juvenile idiopathic arthritis)     Other accident 2016    arthrocentesis of the jaw            PSH  Past Surgical History:   Procedure Laterality Date    ANKLE ARTHROCENTESIS      KNEE ARTHROCENTESIS      TONSILLECTOMY  2009    WISDOM TOOTH EXTRACTION         SH  Social History     Socioeconomic History    Marital status: Single   Tobacco Use    Smoking status: Never    Smokeless tobacco: Never   Vaping Use    Vaping Use: Never used   Substance and Sexual  Activity    Alcohol use: Yes     Comment: social - every other week    Drug use: Never    Sexual activity: Not Currently     Partners: Male     Birth control/protection: Condom, I.U.D.     Social Determinants of Health     Financial Resource Strain: Low Risk     Difficulty of Paying Living Expenses: Not very hard   Food Insecurity: No Food Insecurity    Worried About Programme researcher, broadcasting/film/video in the Last Year: Never true    Ran Out of Food in the Last Year: Never true   Transportation Needs: No Transportation Needs    Lack of Transportation (Medical): No    Lack of Transportation (Non-Medical): No   Physical Activity: Sufficiently Active    Days of Exercise per Week: 4 days    Minutes of Exercise per Session: 60 min   Stress: Stress Concern Present    Feeling of Stress : To some extent  Social Connections: Moderately Integrated    Frequency of Communication with Friends and Family: More than three times a week    Frequency of Social Gatherings with Friends and Family: More than three times a week    Attends Religious Services: 1 to 4 times per year    Active Member of [employer] or Organizations: Yes    Attends Engineer, structural: More than 4 times per year    Marital Status: Never married   Catering manager Violence: Not At Risk    Fear of Current or Ex-Partner: No    Emotionally Abused: No    Physically Abused: No    Sexually Abused: No   Housing Stability: Low Risk     Unable to Pay for Housing in the Last Year: No    Number of Places Lived in the Last Year: 2    Unstable Housing in the Last Year: No       FH  Family History   Problem Relation Age of Onset    No known problems Mother     No known problems Father     No known problems Brother     Breast cancer Paternal Grandmother     Cervical cancer Maternal Grandmother     Cancer Neg Hx     Colon cancer Neg Hx     Diabetes Neg Hx     Eclampsia Neg Hx     Hypertension Neg Hx     Miscarriages / Stillbirths Neg Hx     Ovarian cancer Neg Hx     Preterm labor Neg Hx      Stroke Neg Hx      VITAL SIGNS  BP 113/71   Pulse 97   Temp 98.6 F (37 C) (Temporal)   Ht 5' 11.77" (1.823 m)   Wt 193 lb 6.4 oz (87.7 kg)   LMP 03/23/2021 (Exact Date)   BMI 26.40 kg/m     EXAM  GENERAL APPEARANCE    Alert, Well appearing, Oriented x 3, In no distress    BREAST  Sexual Maturity:  Tanner 5  Nipples:  Left - normal, no discharge; Right - normal, no discharge  Breast:  symmetrical; Left - normal, non-tender, no mass; Right - normal, non-tender, no mass    PELVIC  Vulva:  normal, no lesions, normal clitorus  Perineum:  intact  Introitus:  normal, no lesions, no discharge  Urethra:  normal  Vagina:  normal, no lesions, no masses, no tenderness  Cervix:  normal, no discharge, no lesions, non-friable, IUD strings noted  Uterus:  normal, not enlarged, no masses  Adnexa:  Left - normal, non-tender, not enlarged; Right - normal, non-tender, not enlarged  Rectal:  deferred    ASSESSMENT  1. Encounter for gynecological examination (general) (routine) without abnormal findings    2. IUD (intrauterine device) in place    3. Screen for STD (sexually transmitted disease)        PLAN  Orders Placed This Encounter   Procedures    Genital - Chlamydia/Neisseria/Trich PCR    Hepatitis B (HBV) Surface Antigen w/ Reflex to Confirmation    Hepatitis C (HCV) antibody, Total    HIV-1/2 Ag/Ab 4th Gen. w/ Reflex    Syphilis Screen IgG and IgM     Pt aware normal results will be sent via My Chart.  Pap today.  Reviewed what STD panel includes.  Reassured regarding amenorrhea/irregular cycles with Kyleena.    Jane Mccormick L. Hikari Tripp, MS, RN, WHNP-BC

## 2021-05-02 LAB — GENITAL - CHLAMYDIA/NEISSERIA/TRICH PCR
Chlamydia DNA by PCR: NEGATIVE
Neisseria gonorrhoeae by PCR: NEGATIVE
Trichomonas vaginalis, DNA: NEGATIVE

## 2021-05-05 LAB — LAB USE ONLY - HISTORICAL GYN CYTOLOGY/PAP SMEAR

## 2021-05-14 ENCOUNTER — Telehealth: Payer: Self-pay

## 2021-05-14 ENCOUNTER — Other Ambulatory Visit (INDEPENDENT_AMBULATORY_CARE_PROVIDER_SITE_OTHER): Payer: Self-pay | Admitting: Rheumatology

## 2021-05-14 DIAGNOSIS — M0880 Other juvenile arthritis, unspecified site: Secondary | ICD-10-CM

## 2021-05-14 MED ORDER — TOCILIZUMAB 162 MG/0.9ML SC SOSY
162.0000 mg | PREFILLED_SYRINGE | SUBCUTANEOUS | 3 refills | Status: DC
Start: 2021-05-14 — End: 2021-06-02

## 2021-05-14 NOTE — Telephone Encounter (Addendum)
[name] [phone] for verbal PA. PA denied, does not follow FDA dosage labeling (every 14 days). Waiting for denial letter to forward liaison.    Actemra; Received order via MSOT. Same insurance, new ID. Ran test claim. PA required. Started via [facility]: [id number]. Waiting for clinical questions. Your demographic data has been sent to [facility] successfully! [employer] typically takes 5-10 minutes to respond, but it may take a little longer in some cases. You will be notified by email when available. You can also check for an update later by opening this request from your dashboard. Please do not fax or call [employer] to resubmit this request. If you need assistance, please chat with CoverMyMeds or call us at [phone].

## 2021-05-14 NOTE — Telephone Encounter (Signed)
Patient is requesting:   Requested Prescriptions     Pending Prescriptions Disp Refills    Tocilizumab 162 MG/0.9ML Solution Prefilled Syringe 10.8 mL 3     Sig: Inject 162 mg into the skin once a week       LOV: 11/13/2020      Rx pended for MD review.

## 2021-05-21 ENCOUNTER — Other Ambulatory Visit (INDEPENDENT_AMBULATORY_CARE_PROVIDER_SITE_OTHER): Payer: Self-pay | Admitting: Rheumatology

## 2021-05-21 ENCOUNTER — Encounter (INDEPENDENT_AMBULATORY_CARE_PROVIDER_SITE_OTHER): Payer: Self-pay | Admitting: Rheumatology

## 2021-05-21 MED ORDER — ACTEMRA ACTPEN 162 MG/0.9ML SC SOAJ
162.0000 mg | SUBCUTANEOUS | 2 refills | Status: DC
Start: 2021-05-21 — End: 2021-06-02

## 2021-05-26 ENCOUNTER — Other Ambulatory Visit (INDEPENDENT_AMBULATORY_CARE_PROVIDER_SITE_OTHER): Payer: Self-pay | Admitting: Rheumatology

## 2021-05-26 MED ORDER — METHOTREXATE SODIUM (PF) 50 MG/2ML IJ SOLN
25.0000 mg | INTRAMUSCULAR | 0 refills | Status: DC
Start: 2021-05-26 — End: 2021-07-23

## 2021-05-26 NOTE — Telephone Encounter (Signed)
LOV: 11/13/2020  NOV: 06/03/2021  Last labs: 03/24/2021

## 2021-05-28 ENCOUNTER — Telehealth: Payer: Self-pay

## 2021-05-28 NOTE — Telephone Encounter (Signed)
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  I changed this in her last telemed appointment with me.  Thanks!

## 2021-05-28 NOTE — Telephone Encounter (Signed)
Hi Dr. Mellody Dance,    This patients insurance denied our PA renewal request. They noted FDA dosing regulations. Would you like me to set up a peer to peer or start an urgent appeal? Patient states she will be out of med for next week.     Thanks,  Turkey

## 2021-05-28 NOTE — Telephone Encounter (Signed)
Hi Dr. Mellody Dance,    This patients new insurance has denied the verbal PA for Actemra stating they cannot approve coverage with out know if she has polyarticular or systemic JIA? I tried to find more info in her chart but could not.   Please review and advise.   Thanks,  Turkey

## 2021-05-31 ENCOUNTER — Telehealth: Payer: Self-pay

## 2021-05-31 DIAGNOSIS — M083 Juvenile rheumatoid polyarthritis (seronegative): Secondary | ICD-10-CM

## 2021-05-31 DIAGNOSIS — M0880 Other juvenile arthritis, unspecified site: Secondary | ICD-10-CM

## 2021-05-31 NOTE — Telephone Encounter (Addendum)
Actemra: Case on CMM were closed. Called insurance @ [phone] for verbal PA. PA still denied for not following FDA approved label indication of 162mg  once every 2 weeks. Contacting liaison.    Actemra: Received notification of information update via secure chat. Started PA via [facility] with new information Key: [id number] - PA Case ID: [id number]. Waiting on determination "Your information has been submitted to [employer]. To check for an updated outcome later, reopen this PA request from your dashboard. If [employer] has not responded to your request within 24 hours, contact [employer] at [phone]."

## 2021-06-01 NOTE — Telephone Encounter (Signed)
Actemra- Composed appeal letter for MD to sign. Printed OV notes from 2020 on, faxed to [facility] FEP appeals dept [phone], will f/u on response.

## 2021-06-01 NOTE — Telephone Encounter (Signed)
Hi Dr. Mellody Dance,    Her insurance has denied my second prior authorization attempt for Actemra. They're now saying its above the FDA approved dosing every 2 weeks. I will go ahead and draft an appeal letter for you to sign and let the patient know.  Thanks,   Turkey

## 2021-06-02 ENCOUNTER — Encounter (INDEPENDENT_AMBULATORY_CARE_PROVIDER_SITE_OTHER): Payer: Self-pay | Admitting: Rheumatology

## 2021-06-02 MED ORDER — ACTEMRA ACTPEN 162 MG/0.9ML SC SOAJ
162.0000 mg | SUBCUTANEOUS | 2 refills | Status: DC
Start: 2021-06-02 — End: 2021-11-17

## 2021-06-02 NOTE — Addendum Note (Signed)
Addended by: Marnee Guarneri on: 06/02/2021 02:56 PM     Modules accepted: Orders

## 2021-06-03 ENCOUNTER — Telehealth (INDEPENDENT_AMBULATORY_CARE_PROVIDER_SITE_OTHER): Payer: BLUE CROSS/BLUE SHIELD | Admitting: Rheumatology

## 2021-06-03 NOTE — Telephone Encounter (Signed)
Sounds good, I will let her know and renew the PA request accordingly.

## 2021-06-04 ENCOUNTER — Encounter (INDEPENDENT_AMBULATORY_CARE_PROVIDER_SITE_OTHER): Payer: Self-pay | Admitting: Rheumatology

## 2021-06-04 ENCOUNTER — Telehealth (INDEPENDENT_AMBULATORY_CARE_PROVIDER_SITE_OTHER): Payer: BLUE CROSS/BLUE SHIELD | Admitting: Rheumatology

## 2021-06-04 DIAGNOSIS — M25561 Pain in right knee: Secondary | ICD-10-CM

## 2021-06-04 DIAGNOSIS — M25461 Effusion, right knee: Secondary | ICD-10-CM

## 2021-06-04 DIAGNOSIS — Z79899 Other long term (current) drug therapy: Secondary | ICD-10-CM

## 2021-06-04 DIAGNOSIS — M083 Juvenile rheumatoid polyarthritis (seronegative): Secondary | ICD-10-CM

## 2021-06-04 MED ORDER — PREDNISONE 5 MG PO TABS
ORAL_TABLET | ORAL | 0 refills | Status: DC
Start: 2021-06-04 — End: 2021-07-09

## 2021-06-04 NOTE — Progress Notes (Signed)
[facility] Telehealth Follow-up    PCP: Christen Bame, MD    Verbal consent has been obtained from the patient to conduct a video and telephone visit to minimize exposure to COVID-19: yes    Chief complaint:   Chief Complaint   Patient presents with    Arthritis     Jane Mccormick is a 26 y.o. female who presents to the rheumatology clinic for follow-up.     HPI:   Flare around Christmas while visiting [employer] and [employer].  She used prednisone to get some help with symptoms.   She took about 5 mg prednisone during this period.  Locations: jaw, left ankle, knees. Stiffness. A little knee swelling. Increased warmth of knees. Some ankle swelling.  She had a change in insurance. Same carrier but now under her step mom instead of her father's plan.  Prior biologics included adalimumab, etanercept.      The following sections were reviewed this encounter by the provider:   Tobacco  Allergies  Meds  Problems  Med Hx  Surg Hx  Fam Hx         PMH/PSH:  Past Medical History:   Diagnosis Date    JIA (juvenile idiopathic arthritis)     Other accident 2016    arthrocentesis of the jaw         Meds/ Allergies:  No outpatient medications have been marked as taking for the 06/04/21 encounter (Telemedicine Visit) with Lawrence Marseilles, MD.     No Known Allergies    REVIEW OF SYSTEMS   Review of Systems   Constitutional:  Negative for fever.   Musculoskeletal:  Positive for joint pain.   Skin:  Negative for rash.      PHYSICAL EXAM  There were no vitals filed for this visit.     General appearance:   WNWD, NAD, alert and oriented    Data reviewed this encounter:  Labs:  Lab Results   Component Value Date    WBC 8.2 03/23/2021    HGB 14.3 03/23/2021    HCT 42.4 03/23/2021    MCV 94 03/23/2021    PLT 218 03/23/2021      Lab Results   Component Value Date    CREAT 0.83 03/23/2021    BUN 11 03/23/2021    NA 142 03/23/2021    K 4.1 03/23/2021    CL 103 03/23/2021    CO2 24 03/23/2021      Lab Results   Component Value Date     ALT 13 03/23/2021    AST 18 03/23/2021    ALKPHOS 37 (L) 03/23/2021    BILITOTAL 1.0 03/23/2021      No results found for: ESR   Lab Results   Component Value Date    CRP <1 03/23/2021      No components found for: URIC ACID    ASSESSMENT / PLAN    1. 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 Pain and swelling of right knee  - predniSONE (DELTASONE) 5 MG tablet; 4 tabs daily x1 week, 3 tabs daily x1 week, 2 tabs daily x1 week, 1 tab daily x1 week  then stop  Dispense: 70 tablet; Refill: 0    3. High risk medication use  - CBC and differential; Future  - C Reactive Protein; Future  - Comprehensive metabolic panel; Future    Exacerbation of pJIA (knees, ankles, hands) likely due to lapse in Actemra related to change of insurance. She remains on methotrexate.  Recommendations:  Prednisone burst (20 mg) and taper for flare  Update labs: High risk med: methotrexate.   CBC, CMP every 2-3 months for drug monitoring.  Closer interval f/up to ensure resumption of Actemra restores her to remission.      Return in about 3 months (around 09/01/2021) for Rheumatoid arthritis.      PATIENT INSTRUCTIONS:  Patient Instructions   Lab orders in Mychart under letters.       Marnee Guarneri, MD, FACP, [facility] Rheumatologist  [address]  [place], [place] [address]  P [phone]  F [phone]

## 2021-06-04 NOTE — Patient Instructions (Signed)
Lab orders in Mychart under letters

## 2021-06-07 ENCOUNTER — Telehealth: Payer: Self-pay | Admitting: Rheumatology

## 2021-06-08 NOTE — Telephone Encounter (Signed)
Called and spoke with [facility]ist  [facility] to confirm RX was changed to every 14 days.

## 2021-06-29 ENCOUNTER — Telehealth (INDEPENDENT_AMBULATORY_CARE_PROVIDER_SITE_OTHER): Payer: BLUE CROSS/BLUE SHIELD | Admitting: Rheumatology

## 2021-07-09 ENCOUNTER — Other Ambulatory Visit (INDEPENDENT_AMBULATORY_CARE_PROVIDER_SITE_OTHER): Payer: Self-pay | Admitting: Rheumatology

## 2021-07-09 ENCOUNTER — Encounter (INDEPENDENT_AMBULATORY_CARE_PROVIDER_SITE_OTHER): Payer: Self-pay | Admitting: Rheumatology

## 2021-07-09 DIAGNOSIS — M083 Juvenile rheumatoid polyarthritis (seronegative): Secondary | ICD-10-CM

## 2021-07-09 DIAGNOSIS — M25561 Pain in right knee: Secondary | ICD-10-CM

## 2021-07-22 ENCOUNTER — Other Ambulatory Visit (INDEPENDENT_AMBULATORY_CARE_PROVIDER_SITE_OTHER): Payer: Self-pay | Admitting: Rheumatology

## 2021-07-22 DIAGNOSIS — M083 Juvenile rheumatoid polyarthritis (seronegative): Secondary | ICD-10-CM

## 2021-08-31 ENCOUNTER — Other Ambulatory Visit (INDEPENDENT_AMBULATORY_CARE_PROVIDER_SITE_OTHER): Payer: Self-pay | Admitting: Rheumatology

## 2021-08-31 DIAGNOSIS — M083 Juvenile rheumatoid polyarthritis (seronegative): Secondary | ICD-10-CM

## 2021-10-04 ENCOUNTER — Other Ambulatory Visit (INDEPENDENT_AMBULATORY_CARE_PROVIDER_SITE_OTHER): Payer: Self-pay | Admitting: Rheumatology

## 2021-10-05 MED ORDER — NEEDLES & SYRINGES MISC
25.0000 mg | 3 refills | Status: DC
Start: 2021-10-05 — End: 2021-10-06

## 2021-10-06 ENCOUNTER — Telehealth (INDEPENDENT_AMBULATORY_CARE_PROVIDER_SITE_OTHER): Payer: Self-pay

## 2021-10-06 MED ORDER — NEEDLES & SYRINGES MISC
25.0000 mg | 3 refills | Status: AC
Start: 2021-10-06 — End: ?

## 2021-10-06 NOTE — Addendum Note (Signed)
Addended by: Marnee Guarneri on: 10/06/2021 09:46 AM     Modules accepted: Orders

## 2021-10-06 NOTE — Telephone Encounter (Signed)
Received a call from techinician Gaby from [facility] stated Needles 25 gauge is backorder and the alternative needles for 1ml syringes is 27 gauge 1/2. Please resend rx.

## 2021-10-06 NOTE — Telephone Encounter (Signed)
Re: Med clarification    Received call from [place] at [facility] pharmacy. Clarification given for MTX needles/syringes. Advised to dispense 1 ml syringes with 1/2" 27 ga needle per Dr. Mellody Dance. Modupe voiced understanding.

## 2021-10-08 ENCOUNTER — Telehealth: Payer: Self-pay | Admitting: Rheumatology

## 2021-10-08 NOTE — Telephone Encounter (Signed)
Called and spoke with [facility]. Verify syringes and needles. She stated pharmacy will processing now.

## 2021-11-04 ENCOUNTER — Encounter (INDEPENDENT_AMBULATORY_CARE_PROVIDER_SITE_OTHER): Payer: Self-pay | Admitting: Rheumatology

## 2021-11-17 ENCOUNTER — Other Ambulatory Visit (INDEPENDENT_AMBULATORY_CARE_PROVIDER_SITE_OTHER): Payer: Self-pay

## 2021-11-17 ENCOUNTER — Encounter (INDEPENDENT_AMBULATORY_CARE_PROVIDER_SITE_OTHER): Payer: Self-pay

## 2021-11-17 DIAGNOSIS — M083 Juvenile rheumatoid polyarthritis (seronegative): Secondary | ICD-10-CM

## 2021-11-17 MED ORDER — ACTEMRA ACTPEN 162 MG/0.9ML SC SOAJ
162.0000 mg | SUBCUTANEOUS | 2 refills | Status: DC
Start: 2021-11-17 — End: 2022-06-30

## 2021-11-17 NOTE — Telephone Encounter (Signed)
Requested Prescriptions     Pending Prescriptions Disp Refills    Tocilizumab (Actemra ACTPen) 162 MG/0.9ML Solution Auto-injector 4 each 2     Sig: Inject 162 mg into the skin every 14 (fourteen) days       Last OV: 06/04/21  Labs: 03/23/21  Next OV: Not on file

## 2021-11-17 NOTE — Telephone Encounter (Signed)
Refill Request received from [facility] for Actemra.   Notes in chart that Pt moved to [place].and will be getting new Rheumatologist   Pt Due for F/U and BW from 06/04/21 OV in May.   Will send message to Pt.

## 2021-11-17 NOTE — Telephone Encounter (Signed)
3 month supply called in so she will have medication coverage until she establishes care in [place].

## 2022-05-06 ENCOUNTER — Encounter (INDEPENDENT_AMBULATORY_CARE_PROVIDER_SITE_OTHER): Payer: Self-pay | Admitting: Rheumatology

## 2022-05-06 ENCOUNTER — Telehealth (INDEPENDENT_AMBULATORY_CARE_PROVIDER_SITE_OTHER): Payer: BLUE CROSS/BLUE SHIELD | Admitting: Rheumatology

## 2022-05-06 DIAGNOSIS — M25561 Pain in right knee: Secondary | ICD-10-CM

## 2022-05-06 DIAGNOSIS — S43431D Superior glenoid labrum lesion of right shoulder, subsequent encounter: Secondary | ICD-10-CM

## 2022-05-06 DIAGNOSIS — M25461 Effusion, right knee: Secondary | ICD-10-CM

## 2022-05-06 DIAGNOSIS — Z79899 Other long term (current) drug therapy: Secondary | ICD-10-CM

## 2022-05-06 DIAGNOSIS — M083 Juvenile rheumatoid polyarthritis (seronegative): Secondary | ICD-10-CM

## 2022-05-06 MED ORDER — METHOTREXATE SODIUM 2.5 MG PO TABS
ORAL_TABLET | ORAL | 1 refills | Status: AC
Start: 2022-05-06 — End: ?

## 2022-05-06 NOTE — Patient Instructions (Signed)
Please find the lab orders in Mychart under Letters.

## 2022-05-06 NOTE — Progress Notes (Signed)
[facility] Tele[facility] Follow-up    PCP: Freddie Apley, MD    Verbal consent has been obtained from the patient to conduct a video and telephone visit to minimize exposure to COVID-19: yes    Chief complaint:   Chief Complaint   Patient presents with    Rheumatoid Arthritis     Jane Mccormick is a 27 y.o. female who presents to the rheumatology clinic for follow-up.     HPI:   Viral illness over Christmas.  Right knee has become swollen. She does not recall an injury.  She notes she needs to update labs.    She has torn her labrum right shoulder playing Volleyball.  She is in physical therapy. Surgical repair deferred. No instability.     The following sections were reviewed this encounter by the provider:   Tobacco  Allergies  Meds  Problems  Med Hx  Surg Hx  Fam Hx         PMH/PSH:  Past Medical History:   Diagnosis Date    JIA (juvenile idiopathic arthritis)     Other accident 2016    arthrocentesis of the jaw         Meds/ Allergies:  No outpatient medications have been marked as taking for the 05/06/22 encounter (Telemedicine Visit) with Lawrence Marseilles, MD.     No Known Allergies    REVIEW OF SYSTEMS   Review of Systems   Musculoskeletal:  Positive for joint pain.   Skin:  Negative for rash.        PHYSICAL EXAM  There were no vitals filed for this visit.     General appearance:   WNWD, NAD, alert and oriented    Data reviewed this encounter:  Labs:  Lab Results   Component Value Date    WBC 8.2 03/23/2021    HGB 14.3 03/23/2021    HCT 42.4 03/23/2021    MCV 94 03/23/2021    PLT 218 03/23/2021      Lab Results   Component Value Date    CREAT 0.83 03/23/2021    BUN 11 03/23/2021    NA 142 03/23/2021    K 4.1 03/23/2021    CL 103 03/23/2021    CO2 24 03/23/2021      Lab Results   Component Value Date    ALT 13 03/23/2021    AST 18 03/23/2021    ALKPHOS 37 (L) 03/23/2021    BILITOTAL 1.0 03/23/2021      No results found for: "ESR"   Lab Results   Component Value Date    CRP <1 03/23/2021       No results found for: "URICACID"    ASSESSMENT / PLAN    1. 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  Dispense: 65 tablet; Refill: 1  - CBC and differential; Future  - C Reactive Protein; Future  - Comprehensive metabolic panel; Future    2. Pain and swelling of right knee    3. Tear of right glenoid labrum, subsequent encounter    4. High risk medication use  - methotrexate 2.5 MG tablet; Take 5 tablets (12.5 mg) once weekly on Fridays.  Dispense: 65 tablet; Refill: 1  - CBC and differential; Future  - C Reactive Protein; Future  - Comprehensive metabolic panel; Future    JIA -overall good control. No extra articular features.  We'll contr Actemra.  Trial of oral instead of injected methotrexate.  Knee swelling - possibly due to recent viral illness.OTC Nsaids for now.  Labral tear - agree with physical therapy. No instability reported.\  High risk med: methotrexate.   CBC, CMP every 2-3 months for drug monitoring.        Return in about 6 months (around 11/04/2022) for Inflammatory Arthritis.      PATIENT INSTRUCTIONS:  Patient Instructions   Please find the lab orders in Mychart under Letters.        Ephriam Knuckles, MD, [place], [facility] Rheumatologist  [address] [place]  [place], [place] [address]  [place] [phone]  F [phone]

## 2022-05-13 ENCOUNTER — Encounter (INDEPENDENT_AMBULATORY_CARE_PROVIDER_SITE_OTHER): Payer: Self-pay | Admitting: Rheumatology

## 2022-05-14 LAB — COMPREHENSIVE METABOLIC PANEL
ALT: 17 IU/L (ref 0–32)
AST (SGOT): 17 IU/L (ref 0–40)
Albumin/Globulin Ratio: 2.4 — ABNORMAL HIGH (ref 1.2–2.2)
Albumin: 4.8 g/dL (ref 4.0–5.0)
Alkaline Phosphatase: 40 IU/L — ABNORMAL LOW (ref 44–121)
BUN / Creatinine Ratio: 15 (ref 9–23)
BUN: 13 mg/dL (ref 6–20)
Bilirubin, Total: 0.6 mg/dL (ref 0.0–1.2)
CO2: 23 mmol/L (ref 20–29)
Calcium: 9.5 mg/dL (ref 8.7–10.2)
Chloride: 101 mmol/L (ref 96–106)
Creatinine: 0.84 mg/dL (ref 0.57–1.00)
Globulin, Total: 2 g/dL (ref 1.5–4.5)
Glucose: 115 mg/dL — ABNORMAL HIGH (ref 70–99)
Potassium: 4.2 mmol/L (ref 3.5–5.2)
Protein, Total: 6.8 g/dL (ref 6.0–8.5)
Sodium: 139 mmol/L (ref 134–144)
eGFR: 99 mL/min/{1.73_m2} (ref >59)

## 2022-05-14 LAB — CBC AND DIFFERENTIAL
Baso(Absolute): 0 10*3/uL (ref 0.0–0.2)
Basophils Automated: 0 %
Eosinophils Absolute: 0.2 10*3/uL (ref 0.0–0.4)
Eosinophils Automated: 2 %
Hematocrit: 43.7 % (ref 34.0–46.6)
Hemoglobin: 14.9 g/dL (ref 11.1–15.9)
Immature Granulocytes Absolute: 0 10*3/uL (ref 0.0–0.1)
Immature Granulocytes: 0 %
Lymphocytes Absolute: 1.6 10*3/uL (ref 0.7–3.1)
Lymphocytes Automated: 17 %
MCH: 31.6 pg (ref 26.6–33.0)
MCHC: 34.1 g/dL (ref 31.5–35.7)
MCV: 93 fL (ref 79–97)
Monocytes Absolute: 0.6 10*3/uL (ref 0.1–0.9)
Monocytes: 6 %
Neutrophils Absolute Count: 6.7 10*3/uL (ref 1.4–7.0)
Neutrophils: 75 %
Platelets: 235 10*3/uL (ref 150–450)
RBC: 4.72 x10E6/uL (ref 3.77–5.28)
RDW: 12 % (ref 11.7–15.4)
WBC: 9 10*3/uL (ref 3.4–10.8)

## 2022-05-14 LAB — C-REACTIVE PROTEIN: C-Reactive Protein: <1 mg/L (ref 0–10)

## 2022-05-18 ENCOUNTER — Encounter (INDEPENDENT_AMBULATORY_CARE_PROVIDER_SITE_OTHER): Payer: Self-pay | Admitting: Rheumatology

## 2022-05-19 ENCOUNTER — Encounter (INDEPENDENT_AMBULATORY_CARE_PROVIDER_SITE_OTHER): Payer: Self-pay | Admitting: Rheumatology

## 2022-06-28 ENCOUNTER — Other Ambulatory Visit: Payer: Self-pay

## 2022-06-28 DIAGNOSIS — M083 Juvenile rheumatoid polyarthritis (seronegative): Secondary | ICD-10-CM

## 2022-06-28 NOTE — Telephone Encounter (Signed)
Name, strength, directions of requested refill(s):    Tocilizumab (Actemra ACTPen) 162 MG/0.9ML Solution Auto-injector (Order [id number])     How much medication is remaining: 0    Pharmacy to send refill to or patient to pick up rx from office (mark requested pharmacy in BOLD):      [facility], [place] [address]  [place] [address]  Phone: [phone] Fax: [phone]    Please mark "X" next to the preferred call back number:    Mobile: [phone] (mobile)    Home: [phone] (home)    Work: '@WORKPHONE'$ @        Medication refill request, see above. Thank you   Patient has been informed that medication refill requests should be called in up to one week prior to running out of medication.    Additional Notes: Pharmacy called to request refills  Next Visit: LOV 05/06/2022

## 2022-06-30 MED ORDER — ACTEMRA ACTPEN 162 MG/0.9ML SC SOAJ
162.0000 mg | SUBCUTANEOUS | 5 refills | Status: AC
Start: 2022-06-30 — End: ?

## 2022-06-30 NOTE — Telephone Encounter (Signed)
Requested Prescriptions     Pending Prescriptions Disp Refills    Tocilizumab (Actemra ACTPen) 162 MG/0.9ML Solution Auto-injector 4 each 2     Sig: Inject 162 mg into the skin every 14 (fourteen) days        Medication(s) last refilled: [id number] 90day +2RF  Last Visit: [id number]  Patient:  Does not have an upcoming appointment   Last Labs:  Lab Results   Component Value Date    WBC 9.0 05/13/2022    HGB 14.9 05/13/2022    HCT 43.7 05/13/2022    PLT 235 05/13/2022    CHOL 189 01/31/2020    TRIG 51 01/31/2020    HDL 69 01/31/2020    LDL 110 (H) 01/31/2020    ALT 17 05/13/2022    AST 17 05/13/2022    NA 139 05/13/2022    K 4.2 05/13/2022    CL 101 05/13/2022    CREAT 0.84 05/13/2022    BUN 13 05/13/2022    CO2 23 05/13/2022    TSH 1.490 01/31/2020    GLU 115 (H) 05/13/2022    HGBA1C 6.0 (A) 05/14/2020

## 2022-11-18 ENCOUNTER — Emergency Department: Admit: 2022-11-18 | Payer: PRIVATE HEALTH INSURANCE

## 2022-11-18 ENCOUNTER — Inpatient Hospital Stay
Admit: 2022-11-18 | Discharge: 2022-11-18 | Disposition: A | Discharge status: Home / Self Care | Payer: PRIVATE HEALTH INSURANCE | Attending: Emergency Medicine

## 2022-11-18 DIAGNOSIS — M79661 Pain in right lower leg: Secondary | ICD-10-CM

## 2022-11-18 NOTE — ED Provider Notes (Signed)
[facility]  EMERGENCY DEPARTMENT ENCOUNTER      Pt Name: Samantha Daniels  MRN: [id number]  Birthdate 09-27-1995  Date of evaluation: 11/18/2022  Provider: Noreene Larsson, MD      HISTORY OF PRESENT ILLNESS      HPI  Patient presenting due to right calf pain.  Was on an 8-hour flight from [place] yesterday.  Concerned she has a DVT.  No history of such.  Denies any chest pain or shortness of breath.      Nursing Notes were reviewed.    REVIEW OF SYSTEMS         Review of Systems  All systems reviewed are negative less otherwise document in the HPI      PAST MEDICAL HISTORY     Past Medical History:   Diagnosis Date    Rheumatoid arthritis, juvenile (HCC)          SURGICAL HISTORY       Past Surgical History:   Procedure Laterality Date    ANKLE ARTHROCENTESIS      TONSILLECTOMY           CURRENT MEDICATIONS       Previous Medications    CELECOXIB (CELEBREX) 100 MG CAPSULE    Take 1 capsule by mouth 2 times daily    CHOLECALCIFEROL (VITAMIN D) 50 MCG (2000 UT) CAPS CAPSULE    Take 1 capsule by mouth daily    FOLIC ACID (FOLVITE) 1 MG TABLET    Take 1 tablet by mouth daily    HYDROXYCHLOROQUINE (PLAQUENIL) 200 MG TABLET    Take 1 tablet by mouth 2 times daily    LEVONORGESTREL (KYLEENA) IUD 19.5 MG    1 each by IntraUTERine route once    METHOTREXATE PO    Take 5 mg by mouth every 7 days    PREDNISONE (DELTASONE) 5 MG TABLET    Take 1 tablet by mouth as needed (joint swelling)    TOCILIZUMAB (ACTEMRA) 162 MG/0.9ML SOSY INJECTION    Inject 0.9 mLs into the skin once a week       ALLERGIES     Patient has no known allergies.    FAMILY HISTORY     History reviewed. No pertinent family history.       SOCIAL HISTORY       Social History     Socioeconomic History    Marital status: Single     Spouse name: None    Number of children: None    Years of education: None    Highest education level: None   Tobacco Use    Smoking status: Never     Passive exposure: Never    Smokeless tobacco: Never   Substance and  Sexual Activity    Alcohol use: Yes     Comment: three times per week    Drug use: Not Currently         PHYSICAL EXAM       ED Triage Vitals [11/18/22 1527]   BP Temp Temp Source Pulse Respirations SpO2 Height Weight - Scale   126/86 96.8 F (36 C) Tympanic 64 16 98 % -- 94.5 kg (208 lb 5.4 oz)       There is no height or weight on file to calculate BMI.    Physical Exam  Constitutional:       Comments: Sitting upright in no acute distress   Cardiovascular:      Rate and Rhythm: Normal rate and regular  rhythm.      Comments: 2+ DP pulses bilaterally  Pulmonary:      Effort: Pulmonary effort is normal. No respiratory distress.   Musculoskeletal:         General: No deformity. Normal range of motion.      Right lower leg: No edema.      Left lower leg: No edema.   Skin:     General: Skin is warm and dry.      Findings: No rash.   Neurological:      Comments: Awake and alert.  GCS 15             EMERGENCY DEPARTMENT COURSE and DIFFERENTIAL DIAGNOSIS/MDM:   Vitals:    Vitals:    11/18/22 1527   BP: 126/86   Pulse: 64   Resp: 16   Temp: 96.8 F (36 C)   TempSrc: Tympanic   SpO2: 98%   Weight: 94.5 kg (208 lb 5.4 oz)         Medical Decision Making    Patient presenting with the above chief complaint.  Vitals are stable.  Nontoxic-appearing on exam.  No leg swelling noted.  Neurovascularly intact.  Ultrasound shows no evidence of DVT.  Patient counseled on supportive measures and discharged in stable condition      REASSESSMENT          CONSULTS:  None    PROCEDURES:     Procedures            (Please note that portions of this note were completed with a voice recognition program.  Efforts were made to edit the dictations but occasionally words are mis-transcribed.)    Noreene Larsson, MD (electronically signed)  Emergency Attending Physician              Noreene Larsson, MD  11/18/22 1719

## 2022-11-18 NOTE — ED Notes (Signed)
Pt discharged in stable condition at this time. MD/APP reviewed discharge instructions, prescriptions, and follow up with patient at bedside. Pt verbalized understanding and denies any needs or questions at this time.   Pt NAD on DC ambulatory to drive home

## 2022-11-18 NOTE — Discharge Instructions (Signed)
Recommend ice stretching and ibuprofen.  Return with any new symptoms or concerns

## 2022-11-18 NOTE — ED Triage Notes (Signed)
Patient was on an eight hour flight yesterday and typically wears compression stockings but forgot to do so. Pt developed pain to back of right lower leg that has increased as the day has gone on. Pt concerned she may have a blood clot. No chest pain or shortness of breath. Gait steady.

## 2023-04-17 ENCOUNTER — Ambulatory Visit
Admit: 2023-04-17 | Discharge: 2023-04-24 | Payer: PRIVATE HEALTH INSURANCE | Attending: Student in an Organized Health Care Education/Training Program | Admitting: Student in an Organized Health Care Education/Training Program | Primary: Student in an Organized Health Care Education/Training Program

## 2023-04-17 VITALS — BP 123/84 | HR 75 | Temp 97.80000°F | Resp 16 | Ht 72.0 in | Wt 201.4 lb

## 2023-04-17 DIAGNOSIS — Z0000 Encounter for general adult medical examination without abnormal findings: Secondary | ICD-10-CM

## 2023-04-17 NOTE — Progress Notes (Signed)
 RM:9After obtaining consent, and per orders of Dr. Nedra Hai, injection of flu given in Left deltoid by Iva Boop, LPN. Patient instructed to remain in clinic for 20 minutes afterwards, and to report any adverse reaction to me immediately.    Chief Complain

## 2023-04-17 NOTE — Progress Notes (Signed)
 [facility]    Assessment and Plan   Samantha Daniels is a 27 y.o. female who presents for New Patient (Patient would like general labs done. Would like to check glucose levels)     Diagnosis Orders   1. Well adult ex

## 2023-04-18 LAB — CBC
Hematocrit: 45.1 % (ref 35.0–47.0)
Hemoglobin: 15.1 g/dL (ref 11.5–16.0)
MCH: 30.9 pg (ref 26.0–34.0)
MCHC: 33.5 g/dL (ref 30.0–36.5)
MCV: 92.2 fL (ref 80.0–99.0)
MPV: 10.8 fL (ref 8.9–12.9)
Nucleated RBCs: 0 /100{WBCs}
Platelets: 236 10*3/uL (ref 150–400)
RBC: 4.89 M/uL (ref 3.80–5.20)
RDW: 12.2 % (ref 11.5–14.5)
WBC: 8.2 10*3/uL (ref 3.6–11.0)
nRBC: 0 10*3/uL (ref 0.00–0.01)

## 2023-04-18 LAB — LIPID PANEL
Chol/HDL Ratio: 2.8 (ref 0.0–5.0)
Cholesterol, Total: 206 mg/dL — ABNORMAL HIGH (ref <200)
HDL: 73 mg/dL
LDL Cholesterol: 113.8 mg/dL — ABNORMAL HIGH (ref 0–100)
Triglycerides: 96 mg/dL (ref <150)
VLDL Cholesterol Calculated: 19.2 mg/dL

## 2023-04-18 LAB — HEMOGLOBIN A1C
Estimated Avg Glucose: 120 mg/dL
Hemoglobin A1C: 5.8 % — ABNORMAL HIGH (ref 4.0–5.6)

## 2023-04-18 LAB — COMPREHENSIVE METABOLIC PANEL
ALT: 24 U/L (ref 12–78)
AST: 18 U/L (ref 15–37)
Albumin/Globulin Ratio: 1.6 (ref 1.1–2.2)
Albumin: 4.5 g/dL (ref 3.5–5.0)
Alk Phosphatase: 47 U/L (ref 45–117)
Anion Gap: 6 mmol/L (ref 2–12)
BUN/Creatinine Ratio: 12 (ref 12–20)
BUN: 12 mg/dL (ref 6–20)
CO2: 28 mmol/L (ref 21–32)
Calcium: 9.8 mg/dL (ref 8.5–10.1)
Chloride: 102 mmol/L (ref 97–108)
Creatinine: 1.02 mg/dL (ref 0.55–1.02)
Est, Glom Filt Rate: 78 mL/min/{1.73_m2} (ref >60)
Globulin: 2.9 g/dL (ref 2.0–4.0)
Glucose: 141 mg/dL — ABNORMAL HIGH (ref 65–100)
Potassium: 4.3 mmol/L (ref 3.5–5.1)
Sodium: 136 mmol/L (ref 136–145)
Total Bilirubin: 0.9 mg/dL (ref 0.2–1.0)
Total Protein: 7.4 g/dL (ref 6.4–8.2)

## 2023-04-20 ENCOUNTER — Encounter: Admit: 2023-04-20 | Admitting: Student in an Organized Health Care Education/Training Program

## 2023-04-20 DIAGNOSIS — R7303 Prediabetes: Secondary | ICD-10-CM

## 2023-04-20 LAB — THYROID CASCADE PROFILE: TSH: 1.49 u[IU]/mL (ref 0.450–4.500)

## 2023-04-20 MED ORDER — METFORMIN HCL ER 500 MG PO TB24
500 MG | ORAL_TABLET | Freq: Every day | ORAL | 1 refills | Status: DC
Start: 2023-04-20 — End: 2023-10-27

## 2023-10-27 ENCOUNTER — Encounter

## 2023-10-27 MED ORDER — METFORMIN HCL ER 500 MG PO TB24
500 | ORAL_TABLET | Freq: Every day | ORAL | 1 refills | 90.00000 days | Status: AC
Start: 2023-10-27 — End: ?

## 2023-10-27 NOTE — Telephone Encounter (Signed)
 Please route to front desk to contact patient for scheduling if appropriate. Thanks    Last appointment: 04/17/2023 MD [place]   Next appointment: Nothing scheduled   Previous refill encounter(s):   04/20/2023 Glucophage -XR #90 with 1 refill.     For Pharmacy Admin Tracking Only    Program: Medication Refill  Intervention Detail: New Rx: 1, reason: Patient Preference  Time Spent (min): 5    Requested Prescriptions     Pending Prescriptions Disp Refills    metFORMIN  (GLUCOPHAGE -XR) 500 MG extended release tablet [Pharmacy Med Name: METFORMIN  HCL ER 500 MG TABLET] 90 tablet 0     Sig: TAKE 1 TABLET BY MOUTH EVERY DAY WITH BREAKFAST

## 2024-05-28 ENCOUNTER — Ambulatory Visit
Payer: PRIVATE HEALTH INSURANCE | Attending: Student in an Organized Health Care Education/Training Program | Primary: Student in an Organized Health Care Education/Training Program
